# Patient Record
Sex: Female | Born: 1960 | Race: White | Hispanic: No | Marital: Married | Smoking: Never smoker
Health system: Southern US, Community
[De-identification: ages and names within clinical notes are randomized; demographics above are authoritative.]

## PROBLEM LIST (undated history)

## (undated) DIAGNOSIS — N3281 Overactive bladder: Secondary | ICD-10-CM

## (undated) DIAGNOSIS — M2560 Stiffness of unspecified joint, not elsewhere classified: Secondary | ICD-10-CM

## (undated) DIAGNOSIS — R32 Unspecified urinary incontinence: Secondary | ICD-10-CM

## (undated) HISTORY — DX: Stiffness of unspecified joint, not elsewhere classified: M25.60

## (undated) HISTORY — PX: FOOT SURGERY: SHX648

## (undated) HISTORY — PX: SHOULDER SURGERY: SHX246

## (undated) HISTORY — PX: CRYOTHERAPY: SHX1416

## (undated) HISTORY — PX: EYE SURGERY: SHX253

## (undated) HISTORY — DX: Overactive bladder: N32.81

## (undated) HISTORY — PX: CHOLECYSTECTOMY: SHX55

## (undated) HISTORY — DX: Unspecified urinary incontinence: R32

---

## 2004-10-10 ENCOUNTER — Ambulatory Visit: Payer: Self-pay | Admitting: Unknown Physician Specialty

## 2004-10-25 ENCOUNTER — Ambulatory Visit: Payer: Self-pay | Admitting: Unknown Physician Specialty

## 2005-11-06 ENCOUNTER — Ambulatory Visit: Payer: Self-pay | Admitting: Unknown Physician Specialty

## 2006-11-21 ENCOUNTER — Ambulatory Visit: Payer: Self-pay | Admitting: Unknown Physician Specialty

## 2006-12-03 ENCOUNTER — Encounter: Payer: Self-pay | Admitting: General Practice

## 2006-12-28 ENCOUNTER — Encounter: Payer: Self-pay | Admitting: General Practice

## 2007-01-14 ENCOUNTER — Ambulatory Visit: Payer: Self-pay

## 2007-03-25 ENCOUNTER — Encounter: Payer: Self-pay | Admitting: Orthopedic Surgery

## 2007-03-30 ENCOUNTER — Encounter: Payer: Self-pay | Admitting: Orthopedic Surgery

## 2007-04-27 ENCOUNTER — Encounter: Payer: Self-pay | Admitting: Orthopedic Surgery

## 2007-05-28 ENCOUNTER — Encounter: Payer: Self-pay | Admitting: Orthopedic Surgery

## 2007-06-27 ENCOUNTER — Encounter: Payer: Self-pay | Admitting: Orthopedic Surgery

## 2007-07-28 ENCOUNTER — Encounter: Payer: Self-pay | Admitting: Orthopedic Surgery

## 2007-08-27 ENCOUNTER — Encounter: Payer: Self-pay | Admitting: Orthopedic Surgery

## 2007-12-30 ENCOUNTER — Ambulatory Visit: Payer: Self-pay | Admitting: Unknown Physician Specialty

## 2008-08-11 ENCOUNTER — Encounter: Admission: RE | Admit: 2008-08-11 | Discharge: 2008-08-11 | Payer: Self-pay | Admitting: Orthopedic Surgery

## 2008-09-16 ENCOUNTER — Encounter: Payer: Self-pay | Admitting: Orthopedic Surgery

## 2008-09-26 ENCOUNTER — Encounter: Payer: Self-pay | Admitting: Orthopedic Surgery

## 2008-10-27 ENCOUNTER — Encounter: Payer: Self-pay | Admitting: Orthopedic Surgery

## 2008-11-26 ENCOUNTER — Encounter: Payer: Self-pay | Admitting: Orthopedic Surgery

## 2008-12-27 ENCOUNTER — Encounter: Payer: Self-pay | Admitting: Orthopedic Surgery

## 2009-01-11 ENCOUNTER — Ambulatory Visit: Payer: Self-pay | Admitting: Unknown Physician Specialty

## 2009-06-16 ENCOUNTER — Ambulatory Visit: Payer: Self-pay | Admitting: General Practice

## 2010-01-17 ENCOUNTER — Ambulatory Visit: Payer: Self-pay | Admitting: Unknown Physician Specialty

## 2011-02-07 ENCOUNTER — Ambulatory Visit: Payer: Self-pay | Admitting: Unknown Physician Specialty

## 2011-11-19 DIAGNOSIS — R3915 Urgency of urination: Secondary | ICD-10-CM | POA: Insufficient documentation

## 2011-11-19 DIAGNOSIS — R350 Frequency of micturition: Secondary | ICD-10-CM | POA: Insufficient documentation

## 2013-11-11 DIAGNOSIS — M19079 Primary osteoarthritis, unspecified ankle and foot: Secondary | ICD-10-CM | POA: Insufficient documentation

## 2013-11-13 DIAGNOSIS — R0609 Other forms of dyspnea: Secondary | ICD-10-CM | POA: Insufficient documentation

## 2013-11-13 DIAGNOSIS — R600 Localized edema: Secondary | ICD-10-CM | POA: Insufficient documentation

## 2013-11-24 DIAGNOSIS — J45909 Unspecified asthma, uncomplicated: Secondary | ICD-10-CM | POA: Insufficient documentation

## 2013-11-24 DIAGNOSIS — J302 Other seasonal allergic rhinitis: Secondary | ICD-10-CM | POA: Insufficient documentation

## 2013-11-24 DIAGNOSIS — D333 Benign neoplasm of cranial nerves: Secondary | ICD-10-CM | POA: Insufficient documentation

## 2013-11-24 DIAGNOSIS — G43909 Migraine, unspecified, not intractable, without status migrainosus: Secondary | ICD-10-CM | POA: Insufficient documentation

## 2014-11-25 DIAGNOSIS — R339 Retention of urine, unspecified: Secondary | ICD-10-CM | POA: Insufficient documentation

## 2014-11-26 ENCOUNTER — Ambulatory Visit: Payer: Self-pay | Admitting: Physician Assistant

## 2014-11-26 ENCOUNTER — Encounter: Payer: Self-pay | Admitting: Physician Assistant

## 2014-11-26 VITALS — BP 139/80 | HR 78 | Temp 98.1°F | Ht 63.0 in | Wt 191.0 lb

## 2014-11-26 DIAGNOSIS — R17 Unspecified jaundice: Secondary | ICD-10-CM

## 2014-11-26 DIAGNOSIS — R319 Hematuria, unspecified: Secondary | ICD-10-CM

## 2014-11-26 LAB — POCT URINALYSIS DIPSTICK
GLUCOSE UA: NEGATIVE
KETONES UA: NEGATIVE
Leukocytes, UA: NEGATIVE
Nitrite, UA: NEGATIVE
Protein, UA: NEGATIVE
UROBILINOGEN UA: 0.2
pH, UA: 5.5

## 2014-11-26 NOTE — Addendum Note (Signed)
Addended by: Versie Starks on: 11/26/2014 02:34 PM   Modules accepted: Orders

## 2014-11-26 NOTE — Progress Notes (Signed)
S:  Was seen by urologist yesterday and noted that there was bilirubin in her urine.  Told her she needed to see her pcp due to urine test.  Has no complaints, doesn't feel bad, just worried.  Didn't have blood in her urine yesterday  O:  Vitals wnl, nad, no cva tenderness, lungs c t a, cv rrr, urine dip trace blood 3+ bili  A:  Abnormal urine, bili  P: labs for hepatic panel

## 2014-11-30 LAB — COMPREHENSIVE METABOLIC PANEL
ALK PHOS: 79 IU/L (ref 39–117)
ALT: 16 IU/L (ref 0–32)
AST: 14 IU/L (ref 0–40)
Albumin/Globulin Ratio: 1.6 (ref 1.1–2.5)
Albumin: 4.2 g/dL (ref 3.5–5.5)
BILIRUBIN TOTAL: 0.3 mg/dL (ref 0.0–1.2)
BUN/Creatinine Ratio: 18 (ref 9–23)
BUN: 14 mg/dL (ref 6–24)
CHLORIDE: 103 mmol/L (ref 97–108)
CO2: 23 mmol/L (ref 18–29)
Calcium: 9.4 mg/dL (ref 8.7–10.2)
Creatinine, Ser: 0.77 mg/dL (ref 0.57–1.00)
GFR calc Af Amer: 102 mL/min/{1.73_m2} (ref >59)
GFR calc non Af Amer: 88 mL/min/{1.73_m2} (ref >59)
GLUCOSE: 127 mg/dL — AB (ref 65–99)
Globulin, Total: 2.6 g/dL (ref 1.5–4.5)
Potassium: 4.6 mmol/L (ref 3.5–5.2)
Sodium: 141 mmol/L (ref 134–144)
TOTAL PROTEIN: 6.8 g/dL (ref 6.0–8.5)

## 2014-12-02 NOTE — Progress Notes (Signed)
I Spoke with the patient about her lab results and she expressed understanding.

## 2015-01-10 ENCOUNTER — Encounter: Payer: Self-pay | Admitting: Physician Assistant

## 2015-01-10 ENCOUNTER — Ambulatory Visit: Payer: Self-pay | Admitting: Physician Assistant

## 2015-01-10 VITALS — BP 130/80 | HR 96 | Temp 98.6°F

## 2015-01-10 DIAGNOSIS — K219 Gastro-esophageal reflux disease without esophagitis: Secondary | ICD-10-CM | POA: Insufficient documentation

## 2015-01-10 MED ORDER — RANITIDINE HCL 150 MG PO TABS: 150.0000 mg | ORAL_TABLET | Freq: Two times a day (BID) | ORAL | Status: DC

## 2015-01-10 NOTE — Progress Notes (Signed)
S: c/o reflux, nexium had stopped working, was doing research and wants to be on h2 instead of ppi; can't always relate it to food, has gotten a wedge for her bed, that helped some, ?if etodolac making it worse  O: vitals wnl, nad, lungs c t a, cv rrr  A: Gerd  P: switch from etodolac to turmeric, try aloe supplement in water, zantac 150mg  bid #60 6 refills

## 2015-02-09 ENCOUNTER — Ambulatory Visit (INDEPENDENT_AMBULATORY_CARE_PROVIDER_SITE_OTHER): Payer: PRIVATE HEALTH INSURANCE

## 2015-02-09 ENCOUNTER — Ambulatory Visit (INDEPENDENT_AMBULATORY_CARE_PROVIDER_SITE_OTHER): Payer: PRIVATE HEALTH INSURANCE | Admitting: Podiatry

## 2015-02-09 ENCOUNTER — Encounter: Payer: Self-pay | Admitting: Podiatry

## 2015-02-09 VITALS — BP 125/78 | HR 85 | Resp 16

## 2015-02-09 DIAGNOSIS — M779 Enthesopathy, unspecified: Secondary | ICD-10-CM

## 2015-02-09 DIAGNOSIS — M79673 Pain in unspecified foot: Secondary | ICD-10-CM | POA: Diagnosis not present

## 2015-02-09 MED ORDER — METHYLPREDNISOLONE 4 MG PO TBPK: ORAL_TABLET | ORAL | Status: DC

## 2015-02-09 NOTE — Progress Notes (Signed)
   Subjective:    Patient ID: Kristy Boyd, female    DOB: 10/05/1960, 54 y.o.   MRN: [id number]  HPI: She presents today as a 54 year old female with a chief complaint of neuroma forefoot bilaterally. She states that she has had neuromas before and she feels that this dull achy pain that she is experiencing beneath the second and third metatarsophalangeal joints is very similar. She states that she has the radiating pain out the toes like she did when she had neuromas however soreness in the forefoot is similar. She denies any trauma. She states that she is only able to wear open heel shoes such as [employer] or [employer]. She has had neuroma surgery by Dr. Elvina Mattes bilaterally several years ago. She has a history of plantar fasciitis and states that her only shoe gear and also the fasciitis is what she is currently wearing. She states that she is unable to wear closed heel shoes causing causes her forefoot to hurt.    Review of Systems  HENT: Positive for sinus pressure.   Cardiovascular: Positive for leg swelling.  Skin: Positive for rash.  All other systems reviewed and are negative.      Objective:   Physical Exam: 54 year old female in no apparent distress presents vital signs stable alert and oriented 3. Pulses are strongly palpable. Neurologic sensorium is intact per Semmes-Weinstein monofilament. Deep tendon reflexes are intact bilateral and muscle strength +5 over 5 dorsiflexion plantar flexors and inverters everters all intrinsic musculature is intact. Orthopedic evaluation demonstrates all joints distal to the ankle have full range of motion without crepitation. She is mild flexible hammertoe deformities with pain on palpation on an range of motion of the second and third metatarsophalangeal joints bilaterally. She has no pain on palpation to the third interdigital spaces where her neuromas were resected. She states that she is doing quite well with this. Radiographs 3 views taken today  do not demonstrate any type of osseus abnormalities other than slightly elongated second and third metatarsals. Flexible hammertoes are noted on exam with pain on end range of motion consistent with capsulitis rather than neuroma. Cutaneous evaluation demonstrates no skin breakdown and no erythema or cellulitis drainage or odor.        Assessment & Plan:  Assessment: Capsulitis second and third metatarsophalangeal joints bilaterally probably associated with abnormal biomechanics due to shoe gear.  Plan: Discussed etiology pathology conservative versus surgical therapies. At this point and started her on a Medrol Dosepak to be followed by etodolac. I also injected the bilateral forefoot today between the second and third metatarsals with Kenalog and local anesthetic. I will follow-up with her in 1 month. We did discuss appropriate shoe gear stretching exercises ice therapy issue your modifications.

## 2015-03-14 ENCOUNTER — Ambulatory Visit: Payer: PRIVATE HEALTH INSURANCE | Admitting: Podiatry

## 2015-03-21 ENCOUNTER — Ambulatory Visit: Payer: PRIVATE HEALTH INSURANCE | Admitting: Podiatry

## 2015-04-13 ENCOUNTER — Ambulatory Visit: Payer: Self-pay | Admitting: Physician Assistant

## 2015-04-13 ENCOUNTER — Encounter: Payer: Self-pay | Admitting: Physician Assistant

## 2015-04-13 ENCOUNTER — Other Ambulatory Visit: Payer: Self-pay | Admitting: Emergency Medicine

## 2015-04-13 VITALS — BP 119/70 | HR 75 | Temp 97.7°F

## 2015-04-13 DIAGNOSIS — M62838 Other muscle spasm: Secondary | ICD-10-CM

## 2015-04-13 MED ORDER — FLUTICASONE PROPIONATE 50 MCG/ACT NA SUSP: NASAL | Status: DC

## 2015-04-13 MED ORDER — CYCLOBENZAPRINE HCL 10 MG PO TABS: 10.0000 mg | ORAL_TABLET | Freq: Three times a day (TID) | ORAL | Status: DC | PRN

## 2015-04-13 NOTE — Progress Notes (Signed)
S: c/o r sided neck spasm, no known injury, used bengay and heat with a little relief but still has large spasm, no numbness or tingling, no loss of motion of neck or shoulder, using ibuprofen instead of lodine  O: vitals wnl, nad, cspine nontender, + large spasm r shoulder going into upper back, grips = b/l, full rom of shoulder and neck, lungs c t a, cv rrr  A: acute muscle spasm r shoulder  P: flexeril, continue ibuprofen, use wet heat and follow with ice

## 2015-04-13 NOTE — Telephone Encounter (Signed)
Med refill approved, just saw patient this am

## 2015-04-13 NOTE — Telephone Encounter (Signed)
Received a faxed medication request from [facility].  Please advise.  Thank you. 

## 2015-04-14 ENCOUNTER — Other Ambulatory Visit: Payer: Self-pay | Admitting: Emergency Medicine

## 2015-04-14 MED ORDER — ETODOLAC 400 MG PO TABS: 400.0000 mg | ORAL_TABLET | Freq: Two times a day (BID) | ORAL | Status: DC

## 2015-04-14 NOTE — Telephone Encounter (Signed)
Med refill approved, pt seen in clinic yesterday

## 2015-04-14 NOTE — Telephone Encounter (Signed)
Received a faxed medication request from [facility].  Please advise.  Thank you. 

## 2015-05-03 ENCOUNTER — Other Ambulatory Visit: Payer: Self-pay | Admitting: Physician Assistant

## 2015-05-06 ENCOUNTER — Other Ambulatory Visit: Payer: Self-pay | Admitting: Physician Assistant

## 2015-06-28 ENCOUNTER — Other Ambulatory Visit: Payer: Self-pay | Admitting: Physician Assistant

## 2015-06-29 NOTE — Telephone Encounter (Signed)
Med refill approved 

## 2015-07-18 ENCOUNTER — Other Ambulatory Visit: Payer: Self-pay | Admitting: Emergency Medicine

## 2015-07-19 MED ORDER — ETODOLAC 400 MG PO TABS: 400.0000 mg | ORAL_TABLET | Freq: Every day | ORAL | Status: DC

## 2015-07-19 NOTE — Telephone Encounter (Signed)
Med refill approved 

## 2015-08-29 ENCOUNTER — Other Ambulatory Visit: Payer: Self-pay | Admitting: Physician Assistant

## 2015-08-29 DIAGNOSIS — K219 Gastro-esophageal reflux disease without esophagitis: Secondary | ICD-10-CM

## 2015-08-29 MED ORDER — RANITIDINE HCL 150 MG PO TABS: 150.0000 mg | ORAL_TABLET | Freq: Two times a day (BID) | ORAL | Status: DC

## 2015-10-07 ENCOUNTER — Encounter: Payer: Self-pay | Admitting: Physician Assistant

## 2015-10-07 ENCOUNTER — Ambulatory Visit: Payer: Self-pay | Admitting: Physician Assistant

## 2015-10-07 DIAGNOSIS — K219 Gastro-esophageal reflux disease without esophagitis: Secondary | ICD-10-CM

## 2015-10-07 MED ORDER — RANITIDINE HCL 150 MG PO TABS: 150.0000 mg | ORAL_TABLET | Freq: Two times a day (BID) | ORAL | 6 refills | Status: DC

## 2015-10-07 MED ORDER — FLUTICASONE PROPIONATE 50 MCG/ACT NA SUSP: 2.0000 | Freq: Every day | NASAL | 6 refills | Status: DC

## 2015-10-07 MED ORDER — ETODOLAC 400 MG PO TABS: 400.0000 mg | ORAL_TABLET | Freq: Every day | ORAL | 6 refills | Status: DC

## 2015-10-07 MED ORDER — SOLIFENACIN SUCCINATE 5 MG PO TABS: 5.0000 mg | ORAL_TABLET | Freq: Every day | ORAL | 6 refills | Status: DC

## 2015-10-07 MED ORDER — FLUTICASONE PROPIONATE 50 MCG/ACT NA SUSP: NASAL | 6 refills | Status: DC

## 2015-10-07 MED ORDER — ETODOLAC 400 MG PO TABS: 400.0000 mg | ORAL_TABLET | Freq: Two times a day (BID) | ORAL | 3 refills | Status: DC

## 2015-10-07 NOTE — Progress Notes (Signed)
   Subjective:    Patient ID: Kristy Boyd, female    DOB: September 12, 1960, 55 y.o.   MRN: [id number]  HPI Patient for annual physical and lab test. States only concern is episodic joint pain lasting 3-5 days per month.     Review of Systems    urinary urgency and micturition. GERD and Allergic Rhinitis. Objective:   Physical Exam HEENT unremarkable except for edematous nasal turbinates. Neck supple without adenopathy. Lungs CTA and Heart RRR. No spinal deformity, No CVA guarding.  F/E ROM of lumbar spine. No upper/lower extremities deformity. F/E ROM, and strength 5/%. Urinary/Gential deferred to treating GYN Doctor. CNII-XII grossly intact.       Assessment & Plan:Well Exam  Refill Ranitidine, Etodolac, Vericare, Vitamin "D" and Flonase.  Follow up treating GYN Doctor. Return with onset of episodic joint pain to consider Sed Rate and muscle enzyme test.

## 2015-10-07 NOTE — Addendum Note (Signed)
Addended by: Rudene Anda T on: 10/07/2015 09:06 AM   Modules accepted: Orders

## 2015-10-08 LAB — CMP12+LP+TP+TSH+6AC+CBC/D/PLT
ALBUMIN: 4.5 g/dL (ref 3.5–5.5)
ALK PHOS: 79 IU/L (ref 39–117)
ALT: 15 IU/L (ref 0–32)
AST: 18 IU/L (ref 0–40)
Albumin/Globulin Ratio: 1.7 (ref 1.2–2.2)
BASOS: 0 %
BUN/Creatinine Ratio: 18 (ref 9–23)
BUN: 13 mg/dL (ref 6–24)
Basophils Absolute: 0 10*3/uL (ref 0.0–0.2)
Bilirubin Total: 0.4 mg/dL (ref 0.0–1.2)
CALCIUM: 9.3 mg/dL (ref 8.7–10.2)
CHOL/HDL RATIO: 2.2 ratio (ref 0.0–4.4)
CREATININE: 0.71 mg/dL (ref 0.57–1.00)
Chloride: 100 mmol/L (ref 96–106)
Cholesterol, Total: 167 mg/dL (ref 100–199)
EOS (ABSOLUTE): 0.1 10*3/uL (ref 0.0–0.4)
Eos: 1 %
Free Thyroxine Index: 1.9 (ref 1.2–4.9)
GFR calc Af Amer: 112 mL/min/{1.73_m2} (ref >59)
GFR calc non Af Amer: 97 mL/min/{1.73_m2} (ref >59)
GGT: 28 IU/L (ref 0–60)
Globulin, Total: 2.7 g/dL (ref 1.5–4.5)
Glucose: 112 mg/dL — ABNORMAL HIGH (ref 65–99)
HDL: 76 mg/dL (ref >39)
HEMATOCRIT: 42 % (ref 34.0–46.6)
Hemoglobin: 14.1 g/dL (ref 11.1–15.9)
IMMATURE GRANS (ABS): 0 10*3/uL (ref 0.0–0.1)
Immature Granulocytes: 0 %
Iron: 123 ug/dL (ref 27–159)
LDH: 167 IU/L (ref 119–226)
LDL CALC: 78 mg/dL (ref 0–99)
LYMPHS ABS: 2.3 10*3/uL (ref 0.7–3.1)
LYMPHS: 30 %
MCH: 30.6 pg (ref 26.6–33.0)
MCHC: 33.6 g/dL (ref 31.5–35.7)
MCV: 91 fL (ref 79–97)
MONOS ABS: 0.5 10*3/uL (ref 0.1–0.9)
Monocytes: 7 %
NEUTROS ABS: 4.6 10*3/uL (ref 1.4–7.0)
Neutrophils: 62 %
PHOSPHORUS: 3.5 mg/dL (ref 2.5–4.5)
POTASSIUM: 4.6 mmol/L (ref 3.5–5.2)
Platelets: 395 10*3/uL — ABNORMAL HIGH (ref 150–379)
RBC: 4.61 x10E6/uL (ref 3.77–5.28)
RDW: 13.4 % (ref 12.3–15.4)
SODIUM: 140 mmol/L (ref 134–144)
T3 Uptake Ratio: 23 % — ABNORMAL LOW (ref 24–39)
T4, Total: 8.1 ug/dL (ref 4.5–12.0)
TSH: 1.26 u[IU]/mL (ref 0.450–4.500)
Total Protein: 7.2 g/dL (ref 6.0–8.5)
Triglycerides: 66 mg/dL (ref 0–149)
Uric Acid: 4.3 mg/dL (ref 2.5–7.1)
VLDL Cholesterol Cal: 13 mg/dL (ref 5–40)
WBC: 7.5 10*3/uL (ref 3.4–10.8)

## 2015-10-08 LAB — VITAMIN D 25 HYDROXY (VIT D DEFICIENCY, FRACTURES): Vit D, 25-Hydroxy: 30.1 ng/mL (ref 30.0–100.0)

## 2015-10-12 LAB — SPECIMEN STATUS REPORT

## 2015-10-12 LAB — HGB A1C W/O EAG: Hgb A1c MFr Bld: 6.1 % — ABNORMAL HIGH (ref 4.8–5.6)

## 2015-10-19 ENCOUNTER — Ambulatory Visit: Payer: Self-pay | Admitting: Physician Assistant

## 2015-10-20 ENCOUNTER — Ambulatory Visit: Payer: Self-pay | Admitting: Physician Assistant

## 2015-10-20 ENCOUNTER — Encounter: Payer: Self-pay | Admitting: Physician Assistant

## 2015-10-20 VITALS — BP 122/84 | HR 80 | Temp 98.2°F

## 2015-10-20 DIAGNOSIS — R300 Dysuria: Secondary | ICD-10-CM

## 2015-10-20 LAB — POCT URINALYSIS DIPSTICK
GLUCOSE UA: NEGATIVE
KETONES UA: NEGATIVE
Nitrite, UA: NEGATIVE
Protein, UA: NEGATIVE
SPEC GRAV UA: 1.01
Urobilinogen, UA: 0.2
pH, UA: 5.5

## 2015-10-20 MED ORDER — CIPROFLOXACIN HCL 250 MG PO TABS: 250.0000 mg | ORAL_TABLET | Freq: Two times a day (BID) | ORAL | 0 refills | Status: DC

## 2015-10-20 NOTE — Progress Notes (Signed)
S:  C/o uti sx for 2 days, burning, urgency, frequency, denies vaginal discharge, abdominal pain or flank pain: some sinus pressure , has been drinking a lot of water today to help with sx; Remainder ros neg  O:  Vitals wnl, nad, ent wnl,  no cva tenderness, back nontender, lungs c t a,cv rrr, abd soft nontender, bs normal, n/v intact ua trace leuks, trace blood  A: uti  P: cipro 250mg  bid x 7d, increase water intake, add cranberry juice, return if not improving in 2 -3 days, return earlier if worsening, discussed pyelonephritis sx

## 2015-12-02 ENCOUNTER — Ambulatory Visit: Payer: Self-pay | Admitting: Physician Assistant

## 2015-12-02 ENCOUNTER — Encounter: Payer: Self-pay | Admitting: Physician Assistant

## 2015-12-02 VITALS — BP 110/78 | HR 72 | Temp 98.1°F

## 2015-12-02 DIAGNOSIS — R059 Cough, unspecified: Secondary | ICD-10-CM

## 2015-12-02 DIAGNOSIS — R05 Cough: Secondary | ICD-10-CM

## 2015-12-02 MED ORDER — ALBUTEROL SULFATE HFA 108 (90 BASE) MCG/ACT IN AERS: 2.0000 | INHALATION_SPRAY | Freq: Four times a day (QID) | RESPIRATORY_TRACT | 0 refills | Status: DC | PRN

## 2015-12-02 MED ORDER — BENZONATATE 200 MG PO CAPS: 200.0000 mg | ORAL_CAPSULE | Freq: Three times a day (TID) | ORAL | 0 refills | Status: DC | PRN

## 2015-12-02 MED ORDER — MONTELUKAST SODIUM 10 MG PO TABS
10.0000 mg | ORAL_TABLET | Freq: Every day | ORAL | 3 refills | Status: DC
Start: 2015-12-02 — End: 2016-03-10

## 2015-12-02 MED ORDER — TRIAMCINOLONE ACETONIDE 55 MCG/ACT NA AERO: 2.0000 | INHALATION_SPRAY | Freq: Every day | NASAL | 12 refills | Status: DC

## 2015-12-02 NOTE — Progress Notes (Signed)
S: here for chronic cough for about a month since ragweed season started, used otc mucinex without relief, ENT doctor told her to stop antihistamines last winter, wasn't sure she should go back on these, cough is sporadic, harsh, and will sometimes hear a wheeze;  states she feels like something is constantly draining in her throat and feels like when she swallows it gets stuck there, no mucus production, no fever/chills, no cp/sob, does state her voice is hoarse  O: vitals wnl, nad, tms clear, nasal mucosa pinkish red, throat wnl, neck supple no lymph, lungs c t a, cv rrr  A: chronic cough most likely due to pnd from seasonal allergies, ?reactive airway disease  P: singulair 10mg  , tessalon perls, albuterol inhaler, nasacort, stop flonase, add claritin during allergy season only, if not improving by next week can try steroid pack, if still not better after that consider cxr and/or GI referal

## 2016-03-10 ENCOUNTER — Other Ambulatory Visit: Payer: Self-pay | Admitting: Physician Assistant

## 2016-03-12 NOTE — Telephone Encounter (Signed)
Med refill for singulair approved 

## 2016-05-23 ENCOUNTER — Encounter: Payer: Self-pay | Admitting: Obstetrics & Gynecology

## 2016-05-23 ENCOUNTER — Ambulatory Visit (INDEPENDENT_AMBULATORY_CARE_PROVIDER_SITE_OTHER): Payer: Managed Care, Other (non HMO) | Admitting: Obstetrics & Gynecology

## 2016-05-23 VITALS — BP 110/70 | HR 68 | Ht 63.0 in | Wt 216.0 lb

## 2016-05-23 DIAGNOSIS — N951 Menopausal and female climacteric states: Secondary | ICD-10-CM

## 2016-05-23 DIAGNOSIS — Z1211 Encounter for screening for malignant neoplasm of colon: Secondary | ICD-10-CM

## 2016-05-23 DIAGNOSIS — Z0000 Encounter for general adult medical examination without abnormal findings: Secondary | ICD-10-CM | POA: Diagnosis not present

## 2016-05-23 MED ORDER — ESTRADIOL 2 MG PO TABS: 2.0000 mg | ORAL_TABLET | Freq: Every day | ORAL | 12 refills | Status: DC

## 2016-05-23 MED ORDER — PROGESTERONE MICRONIZED 100 MG PO CAPS: 100.0000 mg | ORAL_CAPSULE | Freq: Every day | ORAL | 12 refills | Status: DC

## 2016-05-23 NOTE — Patient Instructions (Signed)
1-          Call Kristy Boyd to schedule Mammogram  2-   Menopause and Hormone Replacement Therapy What is hormone replacement therapy? Hormone replacement therapy (HRT) is the use of artificial (synthetic) hormones to replace hormones that your body stops producing during menopause. Menopause is the normal time of life when menstrual periods stop completely and the ovaries stop producing the female hormones estrogen and progesterone. This lack of hormones can affect your health and cause undesirable symptoms. HRT can relieve some of those symptoms. What are my options for HRT? HRT may consist of the synthetic hormones estrogen and progestin, or it may consist of only estrogen (estrogen-only therapy). You and your health care provider will decide which form of HRT is best for you. If you choose to be on HRT and you have a uterus, estrogen and progestin are usually prescribed. Estrogen-only therapy is used for women who do not have a uterus. Possible options for taking HRT include:  Pills.  Patches.  Gels.  Sprays.  Vaginal cream.  Vaginal rings.  Vaginal inserts. The amount of hormone(s) that you take and how long you take the hormone(s) varies depending on your individual health. It is important to:  Begin HRT with the lowest possible dosage.  Stop HRT as soon as your health care provider tells you to stop.  Work with your health care provider so that you feel informed and comfortable with your decisions. What are the benefits of HRT? HRT can reduce the frequency and severity of menopausal symptoms. Benefits of HRT vary depending on the menopausal symptoms that you have, the severity of your symptoms, and your overall health. HRT may help to improve the following menopausal symptoms:  Hot flashes and night sweats. These are sudden feelings of heat that spread over the face and body. The skin may turn red, like a blush. Night sweats are hot flashes that happen while you are sleeping  or trying to sleep.  Bone loss (osteoporosis). The body loses calcium more quickly after menopause, causing the bones to become weaker. This can increase the risk for bone breaks (fractures).  Vaginal dryness. The lining of the vagina can become thin and dry, which can cause pain during sexual intercourse or cause infection, burning, or itching.  Urinary tract infections.  Urinary incontinence. This is a decreased ability to control when you urinate.  Irritability.  Short-term memory problems. What are the risks of HRT? Risks of HRT vary depending on your individual health and medical history. Risks of HRT also depend on whether you receive both estrogen and progestin or you receive estrogen only.HRT may increase the risk of:  Spotting. This is when a small amount of bloodleaks from the vagina unexpectedly.  Endometrial cancer. This cancer is in the lining of the uterus (endometrium).  Breast cancer.  Increased density of breast tissue. This can make it harder to find breast cancer on a breast X-ray (mammogram).  Stroke.  Heart attack.  Blood clots.  Gallbladder disease. Risks of HRT can increase if you have any of the following conditions:  Endometrial cancer.  Liver disease.  Heart disease.  Breast cancer.  History of blood clots.  History of stroke. How should I care for myself while I am on HRT?  Take over-the-counter and prescription medicines only as told by your health care provider.  Get mammograms, pelvic exams, and medical checkups as often as told by your health care provider.  Have Pap tests done as often as  told by your health care provider. A Pap test is sometimes called a Pap smear. It is a screening test that is used to check for signs of cancer of the cervix and vagina. A Pap test can also identify the presence of infection or precancerous changes. Pap tests may be done:  Every 3 years, starting at age 57.  Every 5 years, starting after age 41,  in combination with testing for human papillomavirus (HPV).  More often or less often depending on other medical conditions you have, your age, and other risk factors.  It is your responsibility to get your Pap test results. Ask your health care provider or the department performing the test when your results will be ready.  Keep all follow-up visits as told by your health care provider. This is important. When should I seek medical care? Talk with your health care provider if:  You have any of these:  Pain or swelling in your legs.  Shortness of breath.  Chest pain.  Lumps or changes in your breasts or armpits.  Slurred speech.  Pain, burning, or bleeding when you urine.  You develop any of these:  Unusual vaginal bleeding.  Dizziness or headaches.  Weakness or numbness in any part of your arms or legs.  Pain in your abdomen. This information is not intended to replace advice given to you by your health care provider. Make sure you discuss any questions you have with your health care provider. Document Released: 11/11/2002 Document Revised: 01/10/2016 Document Reviewed: 08/16/2014 Elsevier Interactive Patient Education  2017 [employer].

## 2016-05-23 NOTE — Progress Notes (Signed)
HPI:      [name] is a 56 y.o. G0P0000 who LMP was in the past, she presents today for her annual examination.  The patient has no complaints today. The patient is sexually active. 2016 (no HPV PAP) last pap: was normal. The patient is taking hormone replacement therapy, doing well w regimen started last fall; denies side effect. Patient denies post-menopausal vaginal bleeding.   The patient has regular exercise: yes.   GYN Hx: Last Colonoscopy:4 years ago. Normal.  Last DEXA: never.  PMHx: She  has a past medical history of Joint stiffness and Overactive bladder. Also,  has a past surgical history that includes Cholecystectomy., family history includes Diabetes in her father and paternal aunt; Heart failure in her father; Thyroid disease in her mother.,  reports that she has never smoked. She has never used smokeless tobacco. She reports that she drinks alcohol. She reports that she does not use drugs.  She has a current medication list which includes the following prescription(s): etodolac, fluticasone, ranitidine, solifenacin, vitamin d (ergocalciferol), estradiol, and progesterone. Also, is allergic to codeine; pseudoephedrine; and sulfa antibiotics.  Review of Systems  Constitutional: Negative for chills, fever and malaise/fatigue.  HENT: Negative for congestion, sinus pain and sore throat.   Eyes: Negative for blurred vision and pain.  Respiratory: Negative for cough and wheezing.   Cardiovascular: Negative for chest pain and leg swelling.  Gastrointestinal: Negative for abdominal pain, constipation, diarrhea, heartburn, nausea and vomiting.  Genitourinary: Negative for dysuria, frequency, hematuria and urgency.  Musculoskeletal: Negative for back pain, joint pain, myalgias and neck pain.  Skin: Negative for itching and rash.  Neurological: Negative for dizziness, tremors and weakness.  Endo/Heme/Allergies: Does not bruise/bleed easily.  Psychiatric/Behavioral: Negative  for depression. The patient is not nervous/anxious and does not have insomnia.     Objective: BP 110/70   Pulse 68   Ht 5\' 3"  (1.6 m)   Wt 216 lb (98 kg)   BMI 38.26 kg/m  Physical Exam  Constitutional: She is oriented to person, place, and time. She appears well-developed and well-nourished. No distress.  Genitourinary: Rectum normal, vagina normal and uterus normal. Pelvic exam was performed with patient supine. There is no rash or lesion on the right labia. There is no rash or lesion on the left labia. Vagina exhibits no lesion. No bleeding in the vagina. Right adnexum does not display mass and does not display tenderness. Left adnexum does not display mass and does not display tenderness. Cervix does not exhibit motion tenderness, lesion, friability or polyp.   Uterus is mobile and midaxial. Uterus is not enlarged or exhibiting a mass.  HENT:  Head: Normocephalic and atraumatic. Head is without laceration.  Right Ear: Hearing normal.  Left Ear: Hearing normal.  Nose: No epistaxis.  No foreign bodies.  Mouth/Throat: Uvula is midline, oropharynx is clear and moist and mucous membranes are normal.  Eyes: Pupils are equal, round, and reactive to light.  Neck: Normal range of motion. Neck supple. No thyromegaly present.  Cardiovascular: Normal rate and regular rhythm.  Exam reveals no gallop and no friction rub.   No murmur heard. Pulmonary/Chest: Effort normal and breath sounds normal. No respiratory distress. She has no wheezes. Right breast exhibits no mass, no skin change and no tenderness. Left breast exhibits no mass, no skin change and no tenderness.  Abdominal: Soft. Bowel sounds are normal. She exhibits no distension. There is no tenderness. There is no rebound.  Musculoskeletal: Normal range of  motion.  Neurological: She is alert and oriented to person, place, and time. No cranial nerve deficit.  Skin: Skin is warm and dry.  Psychiatric: She has a normal mood and affect.  Judgment normal.  Vitals reviewed.   Assessment: Annual Exam 1. Annual physical exam   2. Menopausal hot flushes   3. Screen for colon cancer    Plan:            1.  Cervical Screening-  Pap smear done today  2. Breast screening- Exam annually and mammogram scheduled  3. Colonoscopy every 10 years, Hemoccult testing after age 55  4. Labs per PCP   5. Counseling for hormonal therapy: wants to continue HRT at current dose due to moodiness and hot flashes    F/U  Return in about 1 year (around 05/23/2017) for Annual.  Barnett Applebaum, MD, [facility], [facility] 05/23/2016  8:37 AM

## 2016-05-25 ENCOUNTER — Other Ambulatory Visit: Payer: Self-pay | Admitting: Obstetrics & Gynecology

## 2016-05-25 DIAGNOSIS — Z1231 Encounter for screening mammogram for malignant neoplasm of breast: Secondary | ICD-10-CM

## 2016-05-26 LAB — IGP, APTIMA HPV
HPV APTIMA: NEGATIVE
PAP SMEAR COMMENT: 0

## 2016-06-05 ENCOUNTER — Other Ambulatory Visit: Payer: Self-pay | Admitting: Physician Assistant

## 2016-06-05 DIAGNOSIS — K219 Gastro-esophageal reflux disease without esophagitis: Secondary | ICD-10-CM

## 2016-07-03 ENCOUNTER — Ambulatory Visit: Payer: Self-pay

## 2016-07-03 ENCOUNTER — Ambulatory Visit: Payer: Self-pay | Admitting: Family

## 2016-07-03 VITALS — BP 125/80 | HR 91 | Temp 98.2°F

## 2016-07-03 DIAGNOSIS — J019 Acute sinusitis, unspecified: Secondary | ICD-10-CM

## 2016-07-03 MED ORDER — AZITHROMYCIN 250 MG PO TABS: ORAL_TABLET | ORAL | 0 refills | Status: DC

## 2016-07-03 MED ORDER — PREDNISONE 10 MG PO TABS: 30.0000 mg | ORAL_TABLET | Freq: Every day | ORAL | 0 refills | Status: DC

## 2016-07-03 NOTE — Progress Notes (Signed)
S/ seasonal allergies , now with acute sxs , ST, increased PND, blowing , pressure in ears, fatigue and chills the weekend , denies gi complaints, O/ mildly ill VSS ENT tms very retracted and dull, nasal mucosa very swollen, red with copious mucopurulent d/c and max tenderness, neck supple, heart rsr lungs clear A/ acute rhinosinusitis P/ Rx zpack pred pulse x 3 days. Supportive measures discussed. Follow up prn not improving    Nasal saline products bid and prn

## 2016-07-07 ENCOUNTER — Other Ambulatory Visit: Payer: Self-pay | Admitting: Physician Assistant

## 2016-07-07 DIAGNOSIS — K219 Gastro-esophageal reflux disease without esophagitis: Secondary | ICD-10-CM

## 2016-07-09 ENCOUNTER — Other Ambulatory Visit: Payer: Self-pay | Admitting: Physician Assistant

## 2016-07-10 NOTE — Telephone Encounter (Signed)
Med refill for mobic approved 

## 2016-07-11 ENCOUNTER — Other Ambulatory Visit: Payer: Self-pay | Admitting: Physician Assistant

## 2016-07-11 DIAGNOSIS — K219 Gastro-esophageal reflux disease without esophagitis: Secondary | ICD-10-CM

## 2016-07-12 NOTE — Telephone Encounter (Signed)
Med refill for etodolac approved

## 2016-08-03 ENCOUNTER — Telehealth: Payer: Self-pay | Admitting: Emergency Medicine

## 2016-08-03 NOTE — Telephone Encounter (Signed)
Patient called and expressed that she has finished her antibiotics and continues to have a sore throat.  Patient wanted to know if we can call in [employer] [facility] Mouthwash in to [facility] in [place].  I consulted Ron Clent Jacks, PA-C and he authorized me to call in [employer] Magic Mouthwash disp 52ml take 74ml 3 or 4 times a day with no refills.

## 2016-09-08 ENCOUNTER — Other Ambulatory Visit: Payer: Self-pay | Admitting: Physician Assistant

## 2016-09-08 DIAGNOSIS — K219 Gastro-esophageal reflux disease without esophagitis: Secondary | ICD-10-CM

## 2016-09-12 ENCOUNTER — Other Ambulatory Visit: Payer: Self-pay | Admitting: Emergency Medicine

## 2016-09-12 DIAGNOSIS — K219 Gastro-esophageal reflux disease without esophagitis: Secondary | ICD-10-CM

## 2016-09-13 MED ORDER — SOLIFENACIN SUCCINATE 5 MG PO TABS: 5.0000 mg | ORAL_TABLET | Freq: Every day | ORAL | 6 refills | Status: DC

## 2016-09-13 NOTE — Telephone Encounter (Signed)
Med refill for vesicare approved

## 2016-09-28 ENCOUNTER — Other Ambulatory Visit: Payer: Self-pay

## 2016-09-28 DIAGNOSIS — Z299 Encounter for prophylactic measures, unspecified: Secondary | ICD-10-CM

## 2016-09-28 NOTE — Progress Notes (Signed)
Patient came in to have blood drawn for testing for an upcoming appointment with Manuela Schwartz.

## 2016-09-29 LAB — CMP12+LP+TP+TSH+6AC+CBC/D/PLT
A/G RATIO: 1.6 (ref 1.2–2.2)
ALBUMIN: 4.4 g/dL (ref 3.5–5.5)
ALT: 15 IU/L (ref 0–32)
AST: 19 IU/L (ref 0–40)
Alkaline Phosphatase: 91 IU/L (ref 39–117)
BASOS ABS: 0 10*3/uL (ref 0.0–0.2)
BUN/Creatinine Ratio: 21 (ref 9–23)
BUN: 13 mg/dL (ref 6–24)
Basos: 0 %
Bilirubin Total: 0.3 mg/dL (ref 0.0–1.2)
Calcium: 9.2 mg/dL (ref 8.7–10.2)
Chloride: 102 mmol/L (ref 96–106)
Chol/HDL Ratio: 2 ratio (ref 0.0–4.4)
Cholesterol, Total: 183 mg/dL (ref 100–199)
Creatinine, Ser: 0.63 mg/dL (ref 0.57–1.00)
EOS (ABSOLUTE): 0.1 10*3/uL (ref 0.0–0.4)
Eos: 1 %
Estimated CHD Risk: <0.5 times avg. (ref 0.0–1.0)
Free Thyroxine Index: 1.9 (ref 1.2–4.9)
GFR calc non Af Amer: 101 mL/min/{1.73_m2} (ref >59)
GFR, EST AFRICAN AMERICAN: 117 mL/min/{1.73_m2} (ref >59)
GGT: 16 IU/L (ref 0–60)
GLOBULIN, TOTAL: 2.8 g/dL (ref 1.5–4.5)
Glucose: 101 mg/dL — ABNORMAL HIGH (ref 65–99)
HDL: 93 mg/dL (ref >39)
Hematocrit: 41.5 % (ref 34.0–46.6)
Hemoglobin: 14.5 g/dL (ref 11.1–15.9)
IMMATURE GRANS (ABS): 0 10*3/uL (ref 0.0–0.1)
IMMATURE GRANULOCYTES: 0 %
IRON: 125 ug/dL (ref 27–159)
LDH: 177 IU/L (ref 119–226)
LDL Calculated: 78 mg/dL (ref 0–99)
LYMPHS: 29 %
Lymphocytes Absolute: 2.7 10*3/uL (ref 0.7–3.1)
MCH: 32 pg (ref 26.6–33.0)
MCHC: 34.9 g/dL (ref 31.5–35.7)
MCV: 92 fL (ref 79–97)
MONOCYTES: 7 %
Monocytes Absolute: 0.7 10*3/uL (ref 0.1–0.9)
NEUTROS PCT: 63 %
Neutrophils Absolute: 5.8 10*3/uL (ref 1.4–7.0)
PHOSPHORUS: 3.7 mg/dL (ref 2.5–4.5)
PLATELETS: 409 10*3/uL — AB (ref 150–379)
Potassium: 4.3 mmol/L (ref 3.5–5.2)
RBC: 4.53 x10E6/uL (ref 3.77–5.28)
RDW: 13.6 % (ref 12.3–15.4)
Sodium: 136 mmol/L (ref 134–144)
T3 UPTAKE RATIO: 20 % — AB (ref 24–39)
T4 TOTAL: 9.6 ug/dL (ref 4.5–12.0)
TOTAL PROTEIN: 7.2 g/dL (ref 6.0–8.5)
TRIGLYCERIDES: 61 mg/dL (ref 0–149)
TSH: 1.08 u[IU]/mL (ref 0.450–4.500)
Uric Acid: 4.4 mg/dL (ref 2.5–7.1)
VLDL Cholesterol Cal: 12 mg/dL (ref 5–40)
WBC: 9.4 10*3/uL (ref 3.4–10.8)

## 2016-09-29 LAB — VITAMIN D 25 HYDROXY (VIT D DEFICIENCY, FRACTURES): VIT D 25 HYDROXY: 29.4 ng/mL — AB (ref 30.0–100.0)

## 2016-09-29 LAB — HGB A1C W/O EAG: Hgb A1c MFr Bld: 5.6 % (ref 4.8–5.6)

## 2016-10-02 ENCOUNTER — Ambulatory Visit: Payer: Self-pay | Admitting: Physician Assistant

## 2016-10-02 ENCOUNTER — Encounter: Payer: Self-pay | Admitting: Physician Assistant

## 2016-10-02 VITALS — BP 130/80 | HR 81 | Temp 98.5°F | Resp 16 | Ht 63.0 in | Wt 212.0 lb

## 2016-10-02 DIAGNOSIS — K529 Noninfective gastroenteritis and colitis, unspecified: Secondary | ICD-10-CM

## 2016-10-02 DIAGNOSIS — Z299 Encounter for prophylactic measures, unspecified: Secondary | ICD-10-CM

## 2016-10-02 DIAGNOSIS — Z0000 Encounter for general adult medical examination without abnormal findings: Secondary | ICD-10-CM

## 2016-10-02 NOTE — Progress Notes (Signed)
S: pt here for wellness physical, states she is having chronic problems with diarrhea that have been worsening, sx started after she had her gallbladder removed years ago, but would go 2-3 weeks and not have a problem, now its daily and interfering with daily activities;  no other complaints ros neg. PMH:  See chart  Social:  Nonsmoker Fam: see chart  O: vitals wnl, nad, ENT wnl, neck supple no lymph, lungs c t a, cv rrr, abd soft nontender bs normal all 4 quads  A: wellness physical  P: will refer to GI, repeat labs as instructed for increased platelets and low t3, mammogram ordered at pt's request

## 2016-10-18 ENCOUNTER — Other Ambulatory Visit: Payer: Self-pay | Admitting: Physician Assistant

## 2016-10-18 DIAGNOSIS — K219 Gastro-esophageal reflux disease without esophagitis: Secondary | ICD-10-CM

## 2016-10-18 NOTE — Telephone Encounter (Signed)
Med refill for zantac approved

## 2016-11-06 ENCOUNTER — Encounter (INDEPENDENT_AMBULATORY_CARE_PROVIDER_SITE_OTHER): Payer: Self-pay

## 2016-11-06 ENCOUNTER — Ambulatory Visit (INDEPENDENT_AMBULATORY_CARE_PROVIDER_SITE_OTHER): Payer: Managed Care, Other (non HMO) | Admitting: Gastroenterology

## 2016-11-06 ENCOUNTER — Other Ambulatory Visit
Admission: RE | Admit: 2016-11-06 | Discharge: 2016-11-06 | Disposition: A | Discharge status: Home / Self Care | Payer: Managed Care, Other (non HMO) | Source: Ambulatory Visit | Attending: Gastroenterology | Admitting: Gastroenterology

## 2016-11-06 ENCOUNTER — Encounter: Payer: Self-pay | Admitting: Gastroenterology

## 2016-11-06 VITALS — BP 116/75 | HR 88 | Temp 98.1°F | Ht 63.0 in | Wt 212.4 lb

## 2016-11-06 DIAGNOSIS — G43909 Migraine, unspecified, not intractable, without status migrainosus: Secondary | ICD-10-CM | POA: Insufficient documentation

## 2016-11-06 DIAGNOSIS — D333 Benign neoplasm of cranial nerves: Secondary | ICD-10-CM | POA: Insufficient documentation

## 2016-11-06 DIAGNOSIS — L719 Rosacea, unspecified: Secondary | ICD-10-CM | POA: Insufficient documentation

## 2016-11-06 DIAGNOSIS — R197 Diarrhea, unspecified: Secondary | ICD-10-CM

## 2016-11-06 LAB — C-REACTIVE PROTEIN: CRP: 2.1 mg/dL — AB (ref <1.0)

## 2016-11-06 LAB — SEDIMENTATION RATE: SED RATE: 13 mm/h (ref 0–30)

## 2016-11-06 MED ORDER — CHOLESTYRAMINE 4 G PO PACK: 4.0000 g | PACK | Freq: Two times a day (BID) | ORAL | 0 refills | Status: DC

## 2016-11-06 NOTE — Progress Notes (Signed)
Kristy Darby, Boyd [address]  [place]  [place], [place] [address]  Main: [phone]  Fax: [phone]    Gastroenterology Consultation  Referring Provider:     Versie Starks, PA-C Primary Care Physician:  Kristy Starks, PA-C Primary Gastroenterologist:  Dr. Cephas Boyd Reason for Consultation:     Diarrhea        HPI:   Kristy Boyd is a 56 y.o. y/o female referred by Dr. Caryn Boyd, Kristy Dolin, PA-C  for consultation & management of Diarrhea for the last 4 months. Rushes to bathroom for explosive BM after breakfast, sometimes upto 3 BMs daily, consistency is pasty, Non bloody Worse with bacon, pan cake, waffle syrup, egg and cheese biscuits etc Worse in last 54month, every day after breakfast and sometimes after lunch No nocturnal diarrhea or urgency Denies upper GI symptoms, not a/w bloating Used to have similar symptoms but only once a month Lap chole done in 1990, for gall stones, symptoms started since surgery but not as frequent  Denies weight loss, nausea, vomiting, abdominal pain, one is about weight gain Did not try bile acid sequestrants She did not have any stool studies done Unknown H. 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 Denies fam h/o GI malignancy  GI Procedures: Colonoscopy 06/02/2012  By Dr Kristy Boyd normal, no random biopsies were performed Upper endoscopy at the same time, report not available  Past Medical History:  Diagnosis Date  . Joint stiffness   . Overactive bladder     Past Surgical History:  Procedure Laterality Date  . CHOLECYSTECTOMY      Prior to Admission medications   Medication Sig Start Date End Date Taking? Authorizing Provider  estradiol (ESTRACE) 2 MG  tablet Take 1 tablet (2 mg total) by mouth daily. 05/23/16  Yes HGae Dry Boyd  etodolac (LODINE) 400 MG tablet TAKE ONE (1) TABLET BY MOUTH TWO (2) TIMES DAILY 07/12/16  Yes FCaryn SectionSLinden Dolin PA-C  fluticasone (FLONASE) 50 MCG/ACT nasal spray 2 sprays by Each Nare  daily. 10/07/15  Yes SSable Feil PA-C  progesterone (PROMETRIUM) 100 MG capsule Take 1 capsule (100 mg total) by mouth daily. 05/23/16  Yes HGae Dry Boyd  ranitidine (ZANTAC) 150 MG tablet TAKE ONE (1) TABLET BY MOUTH TWO (2) TIMES DAILY 10/18/16  Yes FCaryn SectionSLinden Dolin PA-C  solifenacin (VESICARE) 5 MG tablet Take 1 tablet (5 mg total) by mouth daily. 09/13/16  Yes Kristy Boyd, SLinden Dolin PA-C  Vitamin D, Ergocalciferol, (DRISDOL) 50000 units CAPS capsule TAKE ONE CAPSULE BY MOUTH ONCE A WEEK 07/10/16  Yes FCaryn SectionSLinden Dolin PA-C    Family History  Problem Relation Age of Onset  . Thyroid disease Mother   . Heart failure Father   . Diabetes Father   . Diabetes Paternal Aunt      Social History  Substance Use Topics  . Smoking status: Never Smoker  . Smokeless tobacco: Never Used  . Alcohol use 0.0 oz/week    Allergies as of 11/06/2016 - Review Complete 11/06/2016  Allergen Reaction Noted  . Codeine Other (See Comments) 11/26/2014  . Pseudoephedrine Other (See Comments) 11/26/2014  . Sulfa antibiotics Other (See Comments) 11/11/2013    Review of  Systems:    All systems reviewed and negative except where noted in HPI.   Physical Exam:  BP 116/75   Pulse 88   Temp 98.1 F (36.7 C) (Oral)   Ht '5\' 3"'$  (1.6 m)   Wt 212 lb 6.4 oz (96.3 kg)   BMI 37.62 kg/m  No LMP recorded. Patient is postmenopausal.  General:   Alert,  Well-developed, well-nourished, pleasant and cooperative in NAD Head:  Normocephalic and atraumatic. Eyes:  Sclera clear, no icterus.   Conjunctiva pink. Ears:  Normal auditory acuity. Nose:  No deformity, discharge, or lesions. Mouth:  No deformity or lesions,oropharynx pink & moist. Neck:   Supple; no masses or thyromegaly. Lungs:  Respirations even and unlabored.  Clear throughout to auscultation.   No wheezes, crackles, or rhonchi. No acute distress. Heart:  Regular rate and rhythm; no murmurs, clicks, rubs, or gallops. Abdomen:  Normal bowel sounds.  No bruits.  Soft, non-tender and non-distended without masses, hepatosplenomegaly or hernias noted.  No guarding or rebound tenderness.   Rectal: Nor performed Msk:  Symmetrical without gross deformities. Good, equal movement & strength bilaterally. Pulses:  Normal pulses noted. Extremities:  No clubbing or edema.  No cyanosis. Neurologic:  Alert and oriented x3;  grossly normal neurologically. Skin:  Intact without significant lesions or rashes. No jaundice. Lymph Nodes:  No significant cervical adenopathy. Psych:  Alert and cooperative. Normal mood and affect.  Imaging Studies: None  Assessment and Plan:   [name] is a 56 y.o. y/o female with No significant past medical history, status post laparoscopic cholecystectomy, chronic diarrhea 4 months. Bile salt diarrhea or functional diarrhea or microscopy colitis or celiac disease or infectious or inflammatory which is less likely. She does not have anemia.  - Check stool studies including C. difficile and GI pathogen panel - Check H. pylori stool antigen - Check serum TTG IgA, total IgA levels, ESR, CRP - Check fecal pancreatic elastase - Trial of cholestyramine 4 g 1-2 times daily for possible bile salt diarrhea - If above workup is unremarkable, and she continues to have persistent diarrhea despite trial of cholestyramine, recommend colonoscopy with random biopsies to evaluate for microscopic colitis - I will try to obtain her upper endoscopy report  Follow up in 2 months   Kristy Darby, Boyd

## 2016-11-07 LAB — IGA: IgA: 203 mg/dL (ref 87–352)

## 2016-11-09 ENCOUNTER — Encounter: Payer: Self-pay | Admitting: Physician Assistant

## 2016-11-09 ENCOUNTER — Ambulatory Visit: Payer: Self-pay | Admitting: Physician Assistant

## 2016-11-09 VITALS — BP 120/80 | HR 98 | Temp 98.5°F | Resp 16

## 2016-11-09 DIAGNOSIS — N390 Urinary tract infection, site not specified: Secondary | ICD-10-CM

## 2016-11-09 LAB — POCT URINALYSIS DIPSTICK
Ketones, UA: NEGATIVE
NITRITE UA: POSITIVE
PH UA: 5.5 (ref 5.0–8.0)
Spec Grav, UA: <=1.005 — AB (ref 1.010–1.025)
UROBILINOGEN UA: 1 U/dL

## 2016-11-09 MED ORDER — PHENAZOPYRIDINE HCL 200 MG PO TABS: 200.0000 mg | ORAL_TABLET | Freq: Three times a day (TID) | ORAL | 0 refills | Status: DC | PRN

## 2016-11-09 MED ORDER — CIPROFLOXACIN HCL 500 MG PO TABS: 500.0000 mg | ORAL_TABLET | Freq: Two times a day (BID) | ORAL | 0 refills | Status: DC

## 2016-11-09 NOTE — Addendum Note (Signed)
Addended by: Rudene Anda T on: 11/09/2016 10:58 AM   Modules accepted: Orders

## 2016-11-09 NOTE — Progress Notes (Signed)
   Subjective: Dysuria    Patient ID: Kristy Boyd, female    DOB: 03/10/1960, 56 y.o.   MRN: [id number]  HPI Patient awaken this morning with dysuria, urgency, and frequency. Denies, fever vaginal discharge, or flank pain.   Review of Systems Negative except for compliant.    Objective:   Physical Exam Dip UA positive for Nitrate and Leukocytes.       Assessment & Plan:UTU  Take Cipro and Pyridium as directed. Follow up with PCP in 10 days.

## 2016-11-14 ENCOUNTER — Telehealth: Payer: Self-pay

## 2016-11-14 NOTE — Telephone Encounter (Signed)
Will  Try to contact patient on work # because number for home was wrong number. Thanks [employer]

## 2016-11-20 ENCOUNTER — Telehealth: Payer: Self-pay

## 2016-11-20 NOTE — Telephone Encounter (Signed)
Patient has been informed her CRP is mildly elevated concerning for inflammation.She plans on completed her  stool studies this week.  She stated that she did had an UTI and was waiting til it cleared up.  I told her you also recommended colonoscopy to evaluate for IBD. upper endoscopy report has been placed on your desk.  Pt will call back to schedule her Colonoscopy after she looks at work schedule.  Thanks [employer]

## 2016-11-21 ENCOUNTER — Telehealth: Payer: Self-pay | Admitting: Gastroenterology

## 2016-11-21 NOTE — Telephone Encounter (Signed)
Received EGD and colonoscopy reports from Dr. Michaelle Copas office  EGD 06/12/2012  The examined esophagus was normal Localized moderate inflammation characterized by erosions and erythema was found in the gastric antrum. Biopsies were taken. The examined duodenum was normal  Pathology results not sent  Colonoscopy 06/02/2012 The perianal and digital rectal examinations were normal The colon in the examined portion appeared normal The quality of the bowel prep patient was good  Cephas Darby, MD [place]  [place], [place] [address]  Main: [phone]  Fax: [phone] Pager: [phone]

## 2016-11-22 ENCOUNTER — Other Ambulatory Visit
Admission: RE | Admit: 2016-11-22 | Discharge: 2016-11-22 | Disposition: A | Discharge status: Home / Self Care | Payer: Managed Care, Other (non HMO) | Source: Ambulatory Visit | Attending: Gastroenterology | Admitting: Gastroenterology

## 2016-11-22 DIAGNOSIS — R197 Diarrhea, unspecified: Secondary | ICD-10-CM | POA: Diagnosis present

## 2016-11-22 LAB — GASTROINTESTINAL PANEL BY PCR, STOOL (REPLACES STOOL CULTURE)
ASTROVIRUS: NOT DETECTED
Adenovirus F40/41: NOT DETECTED
CAMPYLOBACTER SPECIES: NOT DETECTED
Cryptosporidium: NOT DETECTED
Cyclospora cayetanensis: NOT DETECTED
ENTAMOEBA HISTOLYTICA: NOT DETECTED
ENTEROTOXIGENIC E COLI (ETEC): NOT DETECTED
Enteroaggregative E coli (EAEC): NOT DETECTED
Enteropathogenic E coli (EPEC): NOT DETECTED
Giardia lamblia: NOT DETECTED
NOROVIRUS GI/GII: NOT DETECTED
PLESIMONAS SHIGELLOIDES: NOT DETECTED
ROTAVIRUS A: NOT DETECTED
SALMONELLA SPECIES: NOT DETECTED
SAPOVIRUS (I, II, IV, AND V): NOT DETECTED
SHIGA LIKE TOXIN PRODUCING E COLI (STEC): NOT DETECTED
SHIGELLA/ENTEROINVASIVE E COLI (EIEC): NOT DETECTED
VIBRIO SPECIES: NOT DETECTED
Vibrio cholerae: NOT DETECTED
Yersinia enterocolitica: NOT DETECTED

## 2016-11-24 LAB — MISC LABCORP TEST (SEND OUT): Labcorp test code: 86207

## 2016-11-24 LAB — H. PYLORI ANTIGEN, STOOL: H. PYLORI STOOL AG, EIA: NEGATIVE

## 2016-11-27 ENCOUNTER — Ambulatory Visit: Payer: Self-pay | Admitting: Family

## 2016-11-27 VITALS — BP 120/70 | HR 85 | Temp 98.5°F | Resp 16

## 2016-11-27 DIAGNOSIS — Z299 Encounter for prophylactic measures, unspecified: Secondary | ICD-10-CM

## 2016-11-27 LAB — POCT URINALYSIS DIPSTICK
Blood, UA: NEGATIVE
GLUCOSE UA: NEGATIVE
Ketones, UA: NEGATIVE
Leukocytes, UA: NEGATIVE
NITRITE UA: NEGATIVE
Protein, UA: NEGATIVE
SPEC GRAV UA: 1.015 (ref 1.010–1.025)
UROBILINOGEN UA: 0.2 U/dL
pH, UA: 7 (ref 5.0–8.0)

## 2016-11-27 NOTE — Progress Notes (Signed)
S/denies symptoms of UTI, here for follow-up dip one week after treatment with Cipro O/:dip negative for nitrites. leukocytes A /history of recurrent UTI  P encouraged hydration with water, limit caffeine, continue Vesicare.

## 2016-11-28 LAB — PANCREATIC ELASTASE, FECAL: Pancreatic Elastase-1, Stool: >500 ug Elast./g (ref >200)

## 2016-11-30 ENCOUNTER — Telehealth: Payer: Self-pay

## 2016-11-30 NOTE — Telephone Encounter (Signed)
Patient has been notified her work up for diarrhea so far came back negative. If cholestyramine is not helping with her diarrhea,you would recommend colonoscopy with biopsies. She said she has not started the medication yet as to she has been out of town and didn't want to take a risk.  She will let us know how it does for her once she starts to take it.  Thanks [employer]

## 2016-12-17 ENCOUNTER — Telehealth: Payer: Self-pay

## 2016-12-17 ENCOUNTER — Telehealth: Payer: Self-pay | Admitting: Gastroenterology

## 2016-12-17 ENCOUNTER — Other Ambulatory Visit: Payer: Self-pay

## 2016-12-17 MED ORDER — CHOLESTYRAMINE 4 G PO PACK: 4.0000 g | PACK | Freq: Two times a day (BID) | ORAL | 1 refills | Status: DC

## 2016-12-17 NOTE — Telephone Encounter (Signed)
Patient would like for you to call her back. She needs to let you know how she is feeling. She goes by Kristy Boyd

## 2016-12-17 NOTE — Telephone Encounter (Signed)
Pt requesting call back regarding Mammogram scheduling. [phone].

## 2016-12-17 NOTE — Telephone Encounter (Signed)
Patient contacted office to let us know that her Cholestyramine is working tremendously and she requested a rx to be sent to pharmacy.  Rx has been sent. Thanks [employer]

## 2016-12-18 ENCOUNTER — Other Ambulatory Visit: Payer: Self-pay | Admitting: Obstetrics & Gynecology

## 2016-12-18 DIAGNOSIS — Z1231 Encounter for screening mammogram for malignant neoplasm of breast: Secondary | ICD-10-CM

## 2017-01-04 ENCOUNTER — Other Ambulatory Visit: Payer: Self-pay | Admitting: Physician Assistant

## 2017-01-04 DIAGNOSIS — K219 Gastro-esophageal reflux disease without esophagitis: Secondary | ICD-10-CM

## 2017-01-08 ENCOUNTER — Ambulatory Visit
Admission: RE | Admit: 2017-01-08 | Discharge: 2017-01-08 | Disposition: A | Discharge status: Home / Self Care | Payer: Managed Care, Other (non HMO) | Source: Ambulatory Visit | Attending: Obstetrics & Gynecology | Admitting: Obstetrics & Gynecology

## 2017-01-08 DIAGNOSIS — Z1231 Encounter for screening mammogram for malignant neoplasm of breast: Secondary | ICD-10-CM | POA: Insufficient documentation

## 2017-01-15 ENCOUNTER — Inpatient Hospital Stay
Admission: RE | Admit: 2017-01-15 | Discharge: 2017-01-15 | Disposition: A | Discharge status: Home / Self Care | Payer: Self-pay | Source: Ambulatory Visit | Attending: *Deleted | Admitting: *Deleted

## 2017-01-15 ENCOUNTER — Other Ambulatory Visit: Payer: Self-pay | Admitting: *Deleted

## 2017-01-15 DIAGNOSIS — Z9289 Personal history of other medical treatment: Secondary | ICD-10-CM

## 2017-02-18 ENCOUNTER — Other Ambulatory Visit: Payer: Self-pay | Admitting: Gastroenterology

## 2017-03-27 ENCOUNTER — Telehealth: Payer: Self-pay | Admitting: Gastroenterology

## 2017-03-27 ENCOUNTER — Telehealth: Payer: Self-pay

## 2017-03-27 ENCOUNTER — Other Ambulatory Visit: Payer: Self-pay | Admitting: Physician Assistant

## 2017-03-27 ENCOUNTER — Other Ambulatory Visit: Payer: Self-pay

## 2017-03-27 DIAGNOSIS — K219 Gastro-esophageal reflux disease without esophagitis: Secondary | ICD-10-CM

## 2017-03-27 DIAGNOSIS — N951 Menopausal and female climacteric states: Secondary | ICD-10-CM

## 2017-03-27 MED ORDER — ESTRADIOL 2 MG PO TABS: 2.0000 mg | ORAL_TABLET | Freq: Every day | ORAL | 12 refills | Status: DC

## 2017-03-27 MED ORDER — CHOLESTYRAMINE 4 G PO PACK: PACK | ORAL | 2 refills | Status: DC

## 2017-03-27 MED ORDER — SOLIFENACIN SUCCINATE 5 MG PO TABS: 5.0000 mg | ORAL_TABLET | Freq: Every day | ORAL | 6 refills | Status: DC

## 2017-03-27 MED ORDER — RANITIDINE HCL 150 MG PO TABS: ORAL_TABLET | ORAL | 6 refills | Status: DC

## 2017-03-27 MED ORDER — ETODOLAC 400 MG PO TABS: ORAL_TABLET | ORAL | 6 refills | Status: DC

## 2017-03-27 MED ORDER — PROGESTERONE MICRONIZED 100 MG PO CAPS: 100.0000 mg | ORAL_CAPSULE | Freq: Every day | ORAL | 12 refills | Status: DC

## 2017-03-27 NOTE — Telephone Encounter (Signed)
*  STAT* If patient is at the pharmacy, call can be transferred to refill team.   1. Which medications need to be refilled? (please list name of each medication and dose if known) Cholestyramin 4 g packet  2. Which pharmacy/location (including street and city if local pharmacy) is medication to be sent to? [facility] in [place]  # [phone]   3. Do they need a 30 day or 90 day supply? 30 day   Generic?  [facility] in [place] closed down so needs new Rx.

## 2017-03-27 NOTE — Progress Notes (Signed)
   Subjective: Medication refill    Patient ID: Kristy Boyd, female    DOB: 01-03-1961, 57 y.o.   MRN: [id number]  HPI Patient requests refill of medication secondary to closing up previous pharmacy.   Review of Systems Negative    Objective:   Physical Exam Deferred       Assessment & Plan: Medication refill  Patient refill for Vesicare, Zantac,Prometrium, Flonase, Etodolac, Estrace and Questran re-ordered. Follow up with PCP.

## 2017-03-27 NOTE — Telephone Encounter (Signed)
Spoke w/pt. She states that her rx's have since showed up in the system and all is well now.

## 2017-03-27 NOTE — Telephone Encounter (Signed)
Pt states her rx's are lost in cyberspace. [facility] was to transfer them to [facility] in [place]. They were not. [facility] claims they cannot retrieve them. Pt requesting rx's be sent to [facility]. [phone].

## 2017-03-28 ENCOUNTER — Ambulatory Visit: Payer: Self-pay

## 2017-03-28 ENCOUNTER — Other Ambulatory Visit: Payer: Self-pay

## 2017-03-28 DIAGNOSIS — K219 Gastro-esophageal reflux disease without esophagitis: Secondary | ICD-10-CM

## 2017-03-28 DIAGNOSIS — N3281 Overactive bladder: Secondary | ICD-10-CM

## 2017-03-28 MED ORDER — SOLIFENACIN SUCCINATE 5 MG PO TABS: 5.0000 mg | ORAL_TABLET | Freq: Every day | ORAL | 6 refills | Status: DC

## 2017-04-05 ENCOUNTER — Ambulatory Visit: Payer: Self-pay | Admitting: Physician Assistant

## 2017-04-05 ENCOUNTER — Encounter: Payer: Self-pay | Admitting: Physician Assistant

## 2017-04-05 VITALS — BP 130/80 | HR 64 | Temp 98.5°F | Resp 18

## 2017-04-05 DIAGNOSIS — R309 Painful micturition, unspecified: Secondary | ICD-10-CM

## 2017-04-05 DIAGNOSIS — N3281 Overactive bladder: Secondary | ICD-10-CM

## 2017-04-05 LAB — POCT URINALYSIS DIPSTICK
Blood, UA: NEGATIVE
Glucose, UA: NEGATIVE
Ketones, UA: NEGATIVE
LEUKOCYTES UA: NEGATIVE
Nitrite, UA: NEGATIVE
Protein, UA: NEGATIVE
Urobilinogen, UA: 0.2 E.U./dL
pH, UA: 5.5 (ref 5.0–8.0)

## 2017-04-05 MED ORDER — SOLIFENACIN SUCCINATE 5 MG PO TABS: 5.0000 mg | ORAL_TABLET | Freq: Every day | ORAL | 6 refills | Status: DC

## 2017-04-05 NOTE — Progress Notes (Signed)
   Subjective: Medication refill    Patient ID: Kristy Boyd, female    DOB: July 31, 1960, 57 y.o.   MRN: [id number]  HPI Patient presents for medication refill of Vesicare for overactive bladder.   Review of Systems Negative except for complaint    Objective:   Physical Exam  UA unremarkable.      Assessment & Plan: Medication refill  Prescription for Vesicare with generic brand authorized.  Follow-up as needed with urologist.

## 2017-05-27 ENCOUNTER — Encounter: Payer: Self-pay | Admitting: Obstetrics & Gynecology

## 2017-05-27 ENCOUNTER — Ambulatory Visit (INDEPENDENT_AMBULATORY_CARE_PROVIDER_SITE_OTHER): Payer: Managed Care, Other (non HMO) | Admitting: Obstetrics & Gynecology

## 2017-05-27 VITALS — BP 120/80 | HR 77 | Ht 63.0 in | Wt 215.0 lb

## 2017-05-27 DIAGNOSIS — Z0000 Encounter for general adult medical examination without abnormal findings: Secondary | ICD-10-CM | POA: Diagnosis not present

## 2017-05-27 DIAGNOSIS — Z1211 Encounter for screening for malignant neoplasm of colon: Secondary | ICD-10-CM

## 2017-05-27 DIAGNOSIS — N951 Menopausal and female climacteric states: Secondary | ICD-10-CM | POA: Insufficient documentation

## 2017-05-27 DIAGNOSIS — Z1231 Encounter for screening mammogram for malignant neoplasm of breast: Secondary | ICD-10-CM | POA: Diagnosis not present

## 2017-05-27 DIAGNOSIS — N3281 Overactive bladder: Secondary | ICD-10-CM | POA: Diagnosis not present

## 2017-05-27 DIAGNOSIS — R922 Inconclusive mammogram: Secondary | ICD-10-CM | POA: Diagnosis not present

## 2017-05-27 DIAGNOSIS — Z1239 Encounter for other screening for malignant neoplasm of breast: Secondary | ICD-10-CM

## 2017-05-27 MED ORDER — PROGESTERONE MICRONIZED 100 MG PO CAPS: 100.0000 mg | ORAL_CAPSULE | Freq: Every day | ORAL | 12 refills | Status: DC

## 2017-05-27 MED ORDER — ESTRADIOL 2 MG PO TABS: 2.0000 mg | ORAL_TABLET | Freq: Every day | ORAL | 12 refills | Status: DC

## 2017-05-27 NOTE — Progress Notes (Signed)
HPI:      [name] is a 57 y.o. G0P0000 who LMP was in the past, she presents today for her annual examination.  The patient has no complaints today. The patient is sexually active. Herlast pap: approximate date 2018 and was normal, last mammogram: approximate date 12/2016 and was normal and labs every summer thru PCP.  The patient does perform self breast exams.  There is no notable family history of breast or ovarian cancer in her family. The patient is taking hormone replacement therapy. Patient denies post-menopausal vaginal bleeding.   The patient has regular exercise: yes. The patient denies current symptoms of depression.    GYN Hx: Last Colonoscopy:5 years ago. Normal.  Last DEXA: never ago.    PMHx: Past Medical History:  Diagnosis Date  . Joint stiffness   . Overactive bladder   . Urinary incontinence    Past Surgical History:  Procedure Laterality Date  . CHOLECYSTECTOMY    . CRYOTHERAPY    . FOOT SURGERY    . SHOULDER SURGERY     Family History  Problem Relation Age of Onset  . Thyroid disease Mother   . Heart failure Father   . Diabetes Father   . Diabetes Paternal Aunt   . Breast cancer Neg Hx    Social History   Tobacco Use  . Smoking status: Never Smoker  . Smokeless tobacco: Never Used  Substance Use Topics  . Alcohol use: Yes    Alcohol/week: 0.0 oz  . Drug use: No    Current Outpatient Medications:  .  estradiol (ESTRACE) 2 MG tablet, Take 1 tablet (2 mg total) by mouth daily., Disp: 30 tablet, Rfl: 12 .  etodolac (LODINE) 400 MG tablet, TAKE ONE (1) TABLET BY MOUTH TWO (2) TIMES DAILY, Disp: 60 tablet, Rfl: 6 .  fluticasone (FLONASE) 50 MCG/ACT nasal spray, 2 sprays by Each Nare  daily., Disp: 16 g, Rfl: 6 .  ranitidine (ZANTAC) 150 MG tablet, TAKE ONE (1) TABLET BY MOUTH TWO (2) TIMES DAILY, Disp: 60 tablet, Rfl: 6 .  Vitamin D, Ergocalciferol, (DRISDOL) 50000 units CAPS capsule, TAKE ONE CAPSULE BY MOUTH ONCE A WEEK, Disp: 12 capsule,  Rfl: 3 .  cholestyramine (QUESTRAN) 4 g packet, Take one packet by mouth twice daily, Disp: 60 each, Rfl: 2 .  ciprofloxacin (CIPRO) 500 MG tablet, Take 1 tablet (500 mg total) by mouth 2 (two) times daily. (Patient not taking: Reported on 05/27/2017), Disp: 20 tablet, Rfl: 0 .  desoximetasone (TOPICORT) 0.25 % cream, APPLY SMALL AMOUNT TO SKIN TWICE DAILY AS NEEDED FOR HANDS. DECREASE WITH IMPROVEMENT. NOT FOR FACE, GROIN, UNDER-ARMS. (Patient not taking: Reported on 05/27/2017), Disp: 60 g, Rfl: 6 .  phenazopyridine (PYRIDIUM) 200 MG tablet, Take 1 tablet (200 mg total) by mouth 3 (three) times daily as needed for pain. (Patient not taking: Reported on 05/27/2017), Disp: 6 tablet, Rfl: 0 .  progesterone (PROMETRIUM) 100 MG capsule, Take 1 capsule (100 mg total) by mouth daily., Disp: 30 capsule, Rfl: 12 .  solifenacin (VESICARE) 5 MG tablet, Take 1 tablet (5 mg total) by mouth daily., Disp: 30 tablet, Rfl: 6 .  solifenacin (VESICARE) 5 MG tablet, Take 1 tablet (5 mg total) by mouth daily. (Patient not taking: Reported on 05/27/2017), Disp: 30 tablet, Rfl: 6 Allergies: Codeine; Pseudoephedrine; and Sulfa antibiotics  Review of Systems  Constitutional: Negative for chills, fever and malaise/fatigue.  HENT: Negative for congestion, sinus pain and sore throat.   Eyes: Negative  for blurred vision and pain.  Respiratory: Negative for cough and wheezing.   Cardiovascular: Negative for chest pain and leg swelling.  Gastrointestinal: Negative for abdominal pain, constipation, diarrhea, heartburn, nausea and vomiting.  Genitourinary: Negative for dysuria, frequency, hematuria and urgency.  Musculoskeletal: Positive for joint pain. Negative for back pain, myalgias and neck pain.  Skin: Negative for itching and rash.  Neurological: Negative for dizziness, tremors and weakness.  Endo/Heme/Allergies: Does not bruise/bleed easily.  Psychiatric/Behavioral: Negative for depression. The patient is not nervous/anxious  and does not have insomnia.    Objective: BP 120/80   Pulse 77   Ht 5\' 3"  (1.6 m)   Wt 215 lb (97.5 kg)   BMI 38.09 kg/m   Filed Weights   05/27/17 1505  Weight: 215 lb (97.5 kg)   Body mass index is 38.09 kg/m. Physical Exam  Constitutional: She is oriented to person, place, and time. She appears well-developed and well-nourished. No distress.  Genitourinary: Rectum normal, vagina normal and uterus normal. Pelvic exam was performed with patient supine. There is no rash or lesion on the right labia. There is no rash or lesion on the left labia. Vagina exhibits no lesion. No bleeding in the vagina. Right adnexum does not display mass and does not display tenderness. Left adnexum does not display mass and does not display tenderness. Cervix does not exhibit motion tenderness, lesion, friability or polyp.   Uterus is mobile and midaxial. Uterus is not enlarged or exhibiting a mass.  HENT:  Head: Normocephalic and atraumatic. Head is without laceration.  Right Ear: Hearing normal.  Left Ear: Hearing normal.  Nose: No epistaxis.  No foreign bodies.  Mouth/Throat: Uvula is midline, oropharynx is clear and moist and mucous membranes are normal.  Eyes: Pupils are equal, round, and reactive to light.  Neck: Normal range of motion. Neck supple. No thyromegaly present.  Cardiovascular: Normal rate and regular rhythm. Exam reveals no gallop and no friction rub.  No murmur heard. Pulmonary/Chest: Effort normal and breath sounds normal. No respiratory distress. She has no wheezes. Right breast exhibits no mass, no skin change and no tenderness. Left breast exhibits no mass, no skin change and no tenderness.  Abdominal: Soft. Bowel sounds are normal. She exhibits no distension. There is no tenderness. There is no rebound.  Musculoskeletal: Normal range of motion.  Neurological: She is alert and oriented to person, place, and time. No cranial nerve deficit.  Skin: Skin is warm and dry.    Psychiatric: She has a normal mood and affect. Judgment normal.  Vitals reviewed.   Assessment: Annual Exam 1. Annual physical exam   2. Screen for colon cancer   3. Overactive bladder   4. Menopause syndrome   5. Screening for breast cancer   6. Dense breast tissue on mammogram   7. Menopausal hot flushes    Plan:            1.  Cervical Screening-  Pap smear schedule reviewed with patient  2. Breast screening- Exam annually and mammogram scheduled (3D MMG next visit requested)  3. Colonoscopy every 10 years, Hemoccult testing after age 56  4. Labs managed by PCP   5. Counseling for hormonal therapy: no change in therapy today; Cont HRT w Estradiol 2 mg and Prometrium 100 mg daily I have discussed HRT with the patient in detail.  The risk/benefits of it were reviewed.  She understands that during menopause Estrogen decreases dramatically and that this results in an increased risk  of cardiovascular disease as well as osteoporosis.  We have also discussed the fact that hot flashes often result from a decrease in Estrogen, and that by replacing Estrogen, they can often be alleviated.  We have discussed skin, vaginal and urinary tract changes that may also take place from this drop in Estrogen.  Emotional changes have also been linked to Estrogen and we have briefly discussed this.  The benefits of HRT including decrease in hot flashes, vaginal dryness, and osteoporosis were discussed.  The emotional benefit and a possible change in her cardiovascular risk profile was also reviewed.  The risks associated with Hormone Replacement Therapy were also reviewed.  The use of unopposed Estrogen and its relationship to endometrial cancer was discussed.  The addition of Progesterone and its beneficial effect on endometrial cancer was also noted.  The fact that there has been no consistent definitive studies showing an increase in breast cancer in women who use HRT was discussed with the patient.  The  possible side effects including breast tenderness, fluid retention, mood changes and vaginal bleeding were discussed.  The patient was informed that this is an elective medication and that she may choose not to take Hormone Replacement Therapy.  Literature on HRT was given, and I believe that after answering all of the patient's questions, she has an adequate and informed understanding of HRT.  Special emphasis on the WHI study, as well as several studies since that pertaining to the risks and benefits of estrogen replacement therapy were compared.  The possible limitations of these studies were discussed including the age stratification of the WHI study.  The possible role of Progesterone in these studies was discussed in detail.  I believe that the patient has an informed knowledge of the risks and benefits of HRT.  I have specifically discussed WHI findings and current updates.  Different type of hormone formulation and methods of taking hormone replacement therapy discussed.   6. OAB, cont Oxybutynin or other meds as PCP treats  7. Obesity, counseled as to weight loss options, interventions    F/U  Return in about 1 year (around 05/28/2018) for Annual.  Kristy Applebaum, MD, [facility], [facility] 05/27/2017  3:58 PM

## 2017-05-27 NOTE — Patient Instructions (Signed)
PAP every three years Mammogram every year    Call [phone] to schedule at [facility] Colonoscopy every 10 years Labs yearly (with PCP) 

## 2017-08-14 ENCOUNTER — Ambulatory Visit: Payer: Self-pay

## 2017-08-28 ENCOUNTER — Ambulatory Visit: Payer: Self-pay | Admitting: Family Medicine

## 2017-08-28 VITALS — BP 144/69 | HR 78 | Resp 16 | Ht 63.0 in | Wt 207.0 lb

## 2017-08-28 DIAGNOSIS — Z79899 Other long term (current) drug therapy: Secondary | ICD-10-CM

## 2017-08-28 NOTE — Progress Notes (Signed)
Subjective: Medication refill Patient presents to obtain a refill for cholestryramine. Patient reports a history of menopausal symptoms, overactive bladder, osteoarthritis, cholecystectomy, and allergic rhinitis.  Patient does not currently have a primary care provider. Patient denies needing any other refills at this time. Patient denies any side/adverse effects of this medication and tolerates it very well.  Patient is taking this due to history of a cholecystectomy followed by diarrhea.  Patient reports his medication is very effective in controlling her diarrhea and that this medication was originally prescribed by gastro who recommended she continue taking this.  PCP: None currently. Patient denies any other issues or concerns.   Review of Systems Unremarkable  Objective  Physical Exam General: Awake, alert and oriented. No acute distress. Well developed, hydrated and nourished. Appears stated age.  HEENT: Supple neck without adenopathy. Sclera is non-icteric. The ear canal is clear without discharge. The tympanic membrane is normal in appearance with normal landmarks and cone of light. Nasal mucosa is pink and moist. Oral mucosa is pink and moist. The pharynx is normal in appearance without tonsillar swelling or exudates.  Skin: Skin in warm, dry and intact without rashes or lesions. Appropriate color for ethnicity. Cardiac: Heart rate and rhythm are normal. No murmurs, gallops, or rubs are auscultated.  Respiratory: The chest wall is symmetric and without deformity. No signs of respiratory distress. Lung sounds are clear in all lobes bilaterally without rales, ronchi, or wheezes.  Abdomen: soft, non-tender; bowel sounds normal; no masses,  no organomegaly. Neurological: The patient is awake, alert and oriented to person, place, and time with normal speech.  Memory is normal and thought processes intact. No gait abnormalities are appreciated.  Psychiatric: Appropriate mood and affect.    Assessment Medication refill  Plan  Patient declined routine lab work. Encouraged routine visits with primary care provider.  Provided patient with a list of local resources and encouraged her to establish care with a new primary care provider within the next week.  Provided patient with 2 refills for cholestyramine (written prescription per patient request) so that she does not run out of this medication prior to her first appointment establishing care with a primary care provider.  Patient's blood pressure is 144/69 today.  Discussed normal values.  Advised patient to monitor this daily and report abnormal values to her primary care provider.

## 2018-07-04 ENCOUNTER — Other Ambulatory Visit: Payer: Self-pay | Admitting: Obstetrics & Gynecology

## 2018-07-04 DIAGNOSIS — N951 Menopausal and female climacteric states: Secondary | ICD-10-CM

## 2018-07-04 NOTE — Telephone Encounter (Signed)
Annual in June-July; Rx refills done

## 2018-07-07 NOTE — Telephone Encounter (Signed)
Patient is schedule 08/28/18

## 2018-08-28 ENCOUNTER — Ambulatory Visit (INDEPENDENT_AMBULATORY_CARE_PROVIDER_SITE_OTHER): Payer: Managed Care, Other (non HMO) | Admitting: Obstetrics & Gynecology

## 2018-08-28 ENCOUNTER — Encounter: Payer: Self-pay | Admitting: Obstetrics & Gynecology

## 2018-08-28 ENCOUNTER — Other Ambulatory Visit: Payer: Self-pay

## 2018-08-28 VITALS — BP 130/80 | Ht 63.0 in | Wt 208.0 lb

## 2018-08-28 DIAGNOSIS — N951 Menopausal and female climacteric states: Secondary | ICD-10-CM

## 2018-08-28 DIAGNOSIS — Z79890 Hormone replacement therapy (postmenopausal): Secondary | ICD-10-CM | POA: Insufficient documentation

## 2018-08-28 DIAGNOSIS — Z01419 Encounter for gynecological examination (general) (routine) without abnormal findings: Secondary | ICD-10-CM | POA: Diagnosis not present

## 2018-08-28 DIAGNOSIS — Z1239 Encounter for other screening for malignant neoplasm of breast: Secondary | ICD-10-CM

## 2018-08-28 MED ORDER — OXYBUTYNIN CHLORIDE ER 10 MG PO TB24: 10.0000 mg | ORAL_TABLET | Freq: Every day | ORAL | 3 refills | Status: DC

## 2018-08-28 MED ORDER — PROGESTERONE MICRONIZED 100 MG PO CAPS: 100.0000 mg | ORAL_CAPSULE | Freq: Every day | ORAL | 3 refills | Status: DC

## 2018-08-28 MED ORDER — ESTRADIOL 1 MG PO TABS: 1.0000 mg | ORAL_TABLET | Freq: Every day | ORAL | 3 refills | Status: DC

## 2018-08-28 NOTE — Patient Instructions (Signed)
PAP every three years Mammogram every year    Call [phone] to schedule at [facility] Colonoscopy every 10 years Labs yearly (with PCP)   

## 2018-08-28 NOTE — Progress Notes (Signed)
HPI:      [name] is a 58 y.o. G0P0000 who LMP was in the past, she presents today for her annual examination.  The patient has no complaints today.  No hot flashes and improved mood on HRT.  Bladder sx's in past were overflow incontinence more so than urge/freq, but Ditropan has helped w that as well. The patient is sexually active. Herlast pap: approximate date 2018 and was normal and last mammogram: approximate date 2018 and was normal.  The patient does perform self breast exams.  There is no notable family history of breast or ovarian cancer in her family. The patient is taking hormone replacement therapy. Patient denies post-menopausal vaginal bleeding.   The patient has regular exercise: yes. The patient denies current symptoms of depression.    GYN Hx: Last Colonoscopy:6 yrs ago. Normal.  Last DEXA: never ago.    PMHx: Past Medical History:  Diagnosis Date  . Joint stiffness   . Overactive bladder   . Urinary incontinence    Past Surgical History:  Procedure Laterality Date  . CHOLECYSTECTOMY    . CRYOTHERAPY    . FOOT SURGERY    . SHOULDER SURGERY     Family History  Problem Relation Age of Onset  . Thyroid disease Mother   . Heart failure Father   . Diabetes Father   . Diabetes Paternal Aunt   . Breast cancer Neg Hx    Social History   Tobacco Use  . Smoking status: Never Smoker  . Smokeless tobacco: Never Used  Substance Use Topics  . Alcohol use: Yes    Alcohol/week: 0.0 standard drinks  . Drug use: No    Current Outpatient Medications:  .  cholestyramine (QUESTRAN) 4 g packet, Take one packet by mouth twice daily, Disp: 60 each, Rfl: 2 .  etodolac (LODINE) 400 MG tablet, TAKE ONE (1) TABLET BY MOUTH TWO (2) TIMES DAILY, Disp: 60 tablet, Rfl: 6 .  fluticasone (FLONASE) 50 MCG/ACT nasal spray, 2 sprays by Each Nare  daily. (Patient taking differently: as needed. 2 sprays by Each Nare  daily.), Disp: 16 g, Rfl: 6 .  oxybutynin (DITROPAN-XL) 10 MG 24  hr tablet, Take 1 tablet (10 mg total) by mouth at bedtime., Disp: 90 tablet, Rfl: 3 .  progesterone (PROMETRIUM) 100 MG capsule, Take 1 capsule (100 mg total) by mouth daily., Disp: 90 capsule, Rfl: 3 .  vitamin C (ASCORBIC ACID) 500 MG tablet, Take 500 mg by mouth daily., Disp: , Rfl:  .  Vitamin D, Ergocalciferol, (DRISDOL) 50000 units CAPS capsule, TAKE ONE CAPSULE BY MOUTH ONCE A WEEK, Disp: 12 capsule, Rfl: 3 .  estradiol (ESTRACE) 1 MG tablet, Take 1 tablet (1 mg total) by mouth daily., Disp: 90 tablet, Rfl: 3 .  ranitidine (ZANTAC) 150 MG tablet, TAKE ONE (1) TABLET BY MOUTH TWO (2) TIMES DAILY (Patient not taking: Reported on 08/28/2018), Disp: 60 tablet, Rfl: 6 Allergies: Sulfa antibiotics  Review of Systems  Constitutional: Negative for chills, fever and malaise/fatigue.  HENT: Negative for congestion, sinus pain and sore throat.   Eyes: Negative for blurred vision and pain.  Respiratory: Negative for cough and wheezing.   Cardiovascular: Negative for chest pain and leg swelling.  Gastrointestinal: Negative for abdominal pain, constipation, diarrhea, heartburn, nausea and vomiting.  Genitourinary: Negative for dysuria, frequency, hematuria and urgency.  Musculoskeletal: Negative for back pain, joint pain, myalgias and neck pain.  Skin: Negative for itching and rash.  Neurological: Negative for  dizziness, tremors and weakness.  Endo/Heme/Allergies: Does not bruise/bleed easily.  Psychiatric/Behavioral: Negative for depression. The patient is not nervous/anxious and does not have insomnia.     Objective: BP 130/80   Ht 5\' 3"  (1.6 m)   Wt 208 lb (94.3 kg)   BMI 36.85 kg/m   Filed Weights   08/28/18 0808  Weight: 208 lb (94.3 kg)   Body mass index is 36.85 kg/m. Physical Exam Constitutional:      General: She is not in acute distress.    Appearance: She is well-developed.  Genitourinary:     Pelvic exam was performed with patient supine.     Vagina, uterus and rectum  normal.     No lesions in the vagina.     No vaginal bleeding.     No cervical motion tenderness, friability, lesion or polyp.     Uterus is mobile.     Uterus is not enlarged.     No uterine mass detected.    Uterus is midaxial.     No right or left adnexal mass present.     Right adnexa not tender.     Left adnexa not tender.  HENT:     Head: Normocephalic and atraumatic. No laceration.     Right Ear: Hearing normal.     Left Ear: Hearing normal.     Mouth/Throat:     Pharynx: Uvula midline.  Eyes:     Pupils: Pupils are equal, round, and reactive to light.  Neck:     Musculoskeletal: Normal range of motion and neck supple.     Thyroid: No thyromegaly.  Cardiovascular:     Rate and Rhythm: Normal rate and regular rhythm.     Heart sounds: No murmur. No friction rub. No gallop.   Pulmonary:     Effort: Pulmonary effort is normal. No respiratory distress.     Breath sounds: Normal breath sounds. No wheezing.  Chest:     Breasts:        Right: No mass, skin change or tenderness.        Left: No mass, skin change or tenderness.  Abdominal:     General: Bowel sounds are normal. There is no distension.     Palpations: Abdomen is soft.     Tenderness: There is no abdominal tenderness. There is no rebound.  Musculoskeletal: Normal range of motion.  Neurological:     Mental Status: She is alert and oriented to person, place, and time.     Cranial Nerves: No cranial nerve deficit.  Skin:    General: Skin is warm and dry.  Psychiatric:        Judgment: Judgment normal.  Vitals signs reviewed.     Assessment: Annual Exam 1. Women's annual routine gynecological examination   2. Screening for breast cancer   3. Hormone replacement therapy   4. Menopausal hot flushes     Plan:            1.  Cervical Screening-  Pap smear schedule reviewed with patient  2. Breast screening- Exam annually and mammogram scheduled  3. Colonoscopy every 10 years, Hemoccult testing after  age 70  4. Labs managed by PCP  5. Counseling for hormonal therapy: wants to change HRT or dose due to moodiness, will taper dose and eventually discontinue Estrace and Prometrium  Also discussed Ditropan use and possible ability to discontinue as well  Future risks fro vag atrophy and need for therapy discussed  6. FRAX - FRAX score for assessing the 10 year probability for fracture calculated and discussed today.  Based on age and score today, DEXA is not currently scheduled.    F/U  Return in about 1 year (around 08/28/2019) for Annual.  Barnett Applebaum, MD, [facility], [facility] 08/28/2018  8:53 AM

## 2018-11-04 ENCOUNTER — Other Ambulatory Visit: Payer: Self-pay | Admitting: Obstetrics & Gynecology

## 2018-11-04 DIAGNOSIS — Z1239 Encounter for other screening for malignant neoplasm of breast: Secondary | ICD-10-CM

## 2019-03-20 ENCOUNTER — Ambulatory Visit
Admission: RE | Admit: 2019-03-20 | Discharge: 2019-03-20 | Disposition: A | Discharge status: Home / Self Care | Payer: Managed Care, Other (non HMO) | Source: Ambulatory Visit | Attending: Obstetrics & Gynecology | Admitting: Obstetrics & Gynecology

## 2019-03-20 DIAGNOSIS — Z1231 Encounter for screening mammogram for malignant neoplasm of breast: Secondary | ICD-10-CM | POA: Diagnosis not present

## 2019-03-20 DIAGNOSIS — Z1239 Encounter for other screening for malignant neoplasm of breast: Secondary | ICD-10-CM

## 2019-03-23 ENCOUNTER — Other Ambulatory Visit: Payer: Self-pay | Admitting: Obstetrics & Gynecology

## 2019-03-23 DIAGNOSIS — R928 Other abnormal and inconclusive findings on diagnostic imaging of breast: Secondary | ICD-10-CM

## 2019-03-23 DIAGNOSIS — N6310 Unspecified lump in the right breast, unspecified quadrant: Secondary | ICD-10-CM

## 2019-03-24 ENCOUNTER — Ambulatory Visit
Admission: RE | Admit: 2019-03-24 | Discharge: 2019-03-24 | Disposition: A | Discharge status: Home / Self Care | Payer: Managed Care, Other (non HMO) | Source: Ambulatory Visit | Attending: Obstetrics & Gynecology | Admitting: Obstetrics & Gynecology

## 2019-03-24 DIAGNOSIS — N6310 Unspecified lump in the right breast, unspecified quadrant: Secondary | ICD-10-CM | POA: Diagnosis present

## 2019-03-24 DIAGNOSIS — R928 Other abnormal and inconclusive findings on diagnostic imaging of breast: Secondary | ICD-10-CM | POA: Diagnosis not present

## 2019-08-05 ENCOUNTER — Telehealth: Payer: Self-pay

## 2019-08-05 NOTE — Telephone Encounter (Signed)
Pt calling triage today wanting to speak with Dr. Kenton Kingfisher before her next appt in July. Wants to discuss coming off the hormones. Not seeing much relief with her symptoms. Patient having a lot of anxiety. Tired all the time. Feels stressed. Wondering if she needs to talk to PCP vs Dr Kenton Kingfisher for this issue. Please advise. Pt aware you are out of office today but will return tomorrow. Please send to J. Sharlett Iles as I am out of the office the rest of the week. Pt is aware she will be calling her with advise.

## 2019-08-06 ENCOUNTER — Other Ambulatory Visit: Payer: Self-pay | Admitting: Obstetrics & Gynecology

## 2019-08-06 MED ORDER — CLONIDINE HCL 0.1 MG PO TABS: 0.1000 mg | ORAL_TABLET | Freq: Every day | ORAL | 1 refills | Status: DC

## 2019-08-06 NOTE — Telephone Encounter (Signed)
This was discussed last year and it is OK for her to taper down off of hormones if she would like.  Can discuss further at appointment as there are a lot of variables to discuss based on her symptoms. If she wants to come off of them prior to visit then OK to take every other day for 2 weeks then stop/

## 2019-08-06 NOTE — Telephone Encounter (Signed)
I can certainly help address these sx's.  Will call in medicine to try to bridge these menopause type sx's.  Will discuss further in person at appointment.  Clonidine one pill daily.  ERx.

## 2019-08-06 NOTE — Telephone Encounter (Signed)
Pt aware.

## 2019-08-06 NOTE — Telephone Encounter (Signed)
She has been tapering off but would need something else because still has symptoms. Anger, frustration, pt feels ill very easy, loosing her hair for 3 months, extreme fatigue mood swings, should she speak with her PCP or you?

## 2019-08-11 ENCOUNTER — Ambulatory Visit: Payer: Managed Care, Other (non HMO) | Admitting: Podiatry

## 2019-08-11 ENCOUNTER — Other Ambulatory Visit: Payer: Self-pay

## 2019-08-11 DIAGNOSIS — M79676 Pain in unspecified toe(s): Secondary | ICD-10-CM

## 2019-08-11 DIAGNOSIS — B351 Tinea unguium: Secondary | ICD-10-CM

## 2019-08-11 DIAGNOSIS — M79609 Pain in unspecified limb: Secondary | ICD-10-CM

## 2019-08-16 NOTE — Progress Notes (Signed)
  Subjective:  Patient ID: Kristy Boyd, female    DOB: 12-02-1960,  MRN: [id number]  Chief Complaint  Patient presents with  . debride    BL toenials (1-10), growing in, curling, pinched toenails and thickening x  Few mo; with soreness at Rt 3rd toenail Tx: OTC fungal tx    59 y.o. female presents with the above complaint. History confirmed with patient.   Objective:  Physical Exam: warm, good capillary refill, nails thickened and dystrophic, normal DP and PT pulses and normal sensory exam. Left Foot: normal exam, no swelling, tenderness, instability; ligaments intact, full range of motion of all ankle/foot joints  Right Foot: normal exam, no swelling, tenderness, instability; ligaments intact, full range of motion of all ankle/foot joints    Assessment:   1. Pain due to onychomycosis of nail    Plan:  Patient was evaluated and treated and all questions answered.  Onychomycosis  -Nails palliatively debrided secondary to pain  Procedure: Nail Debridement Rationale: pain Type of Debridement: manual, sharp debridement. Instrumentation: Nail nipper, rotary burr. Number of Nails: 10  No follow-ups on file.

## 2019-08-26 ENCOUNTER — Other Ambulatory Visit: Payer: Self-pay | Admitting: Obstetrics & Gynecology

## 2019-09-01 ENCOUNTER — Other Ambulatory Visit: Payer: Self-pay

## 2019-09-01 ENCOUNTER — Other Ambulatory Visit (HOSPITAL_COMMUNITY)
Admission: RE | Admit: 2019-09-01 | Discharge: 2019-09-01 | Disposition: A | Discharge status: Home / Self Care | Payer: Managed Care, Other (non HMO) | Source: Ambulatory Visit | Attending: Obstetrics & Gynecology | Admitting: Obstetrics & Gynecology

## 2019-09-01 ENCOUNTER — Ambulatory Visit (INDEPENDENT_AMBULATORY_CARE_PROVIDER_SITE_OTHER): Payer: Managed Care, Other (non HMO) | Admitting: Obstetrics & Gynecology

## 2019-09-01 ENCOUNTER — Encounter: Payer: Self-pay | Admitting: Obstetrics & Gynecology

## 2019-09-01 VITALS — BP 120/80 | Ht 63.0 in | Wt 210.0 lb

## 2019-09-01 DIAGNOSIS — Z1231 Encounter for screening mammogram for malignant neoplasm of breast: Secondary | ICD-10-CM

## 2019-09-01 DIAGNOSIS — Z01419 Encounter for gynecological examination (general) (routine) without abnormal findings: Secondary | ICD-10-CM | POA: Diagnosis not present

## 2019-09-01 DIAGNOSIS — N951 Menopausal and female climacteric states: Secondary | ICD-10-CM | POA: Diagnosis not present

## 2019-09-01 DIAGNOSIS — Z124 Encounter for screening for malignant neoplasm of cervix: Secondary | ICD-10-CM | POA: Diagnosis not present

## 2019-09-01 MED ORDER — OXYBUTYNIN CHLORIDE ER 10 MG PO TB24: 10.0000 mg | ORAL_TABLET | Freq: Every day | ORAL | 3 refills | Status: DC

## 2019-09-01 MED ORDER — CLONIDINE HCL 0.1 MG PO TABS: 0.1000 mg | ORAL_TABLET | Freq: Every day | ORAL | 3 refills | Status: DC

## 2019-09-01 NOTE — Progress Notes (Signed)
HPI:      [name] is a 59 y.o. G0P0000 who LMP was in the past, she presents today for her annual examination.  The patient has no complaints today. The patient is sexually active. Herlast pap: approximate date 2018 and was normal and last mammogram: approximate date 2021 (Jan) and was normal.  The patient does perform self breast exams.  There is no notable family history of breast or ovarian cancer in her family. The patient is not currently taking hormone replacement therapy. Patient denies post-menopausal vaginal bleeding.   The patient has regular exercise: yes. The patient denies current symptoms of depression.    GYN Hx: Last Colonoscopy:7 years ago. Normal.  Last DEXA: never ago.    PMHx: Past Medical History:  Diagnosis Date  . Joint stiffness   . Overactive bladder   . Urinary incontinence    Past Surgical History:  Procedure Laterality Date  . CHOLECYSTECTOMY    . CRYOTHERAPY    . EYE SURGERY    . FOOT SURGERY    . SHOULDER SURGERY     Family History  Problem Relation Age of Onset  . Thyroid disease Mother   . Heart failure Father   . Diabetes Father   . Diabetes Paternal Aunt   . Breast cancer Neg Hx    Social History   Tobacco Use  . Smoking status: Never Smoker  . Smokeless tobacco: Never Used  Vaping Use  . Vaping Use: Never used  Substance Use Topics  . Alcohol use: Yes    Alcohol/week: 0.0 standard drinks  . Drug use: No    Current Outpatient Medications:  .  cholestyramine (QUESTRAN) 4 g packet, Take one packet by mouth twice daily, Disp: 60 each, Rfl: 2 .  cloNIDine (CATAPRES) 0.1 MG tablet, Take 1 tablet (0.1 mg total) by mouth daily., Disp: 30 tablet, Rfl: 1 .  etodolac (LODINE) 400 MG tablet, TAKE ONE (1) TABLET BY MOUTH TWO (2) TIMES DAILY, Disp: 60 tablet, Rfl: 6 .  famotidine (PEPCID) 20 MG tablet, Take 20 mg by mouth 2 (two) times daily., Disp: , Rfl:  .  oxybutynin (DITROPAN-XL) 10 MG 24 hr tablet, TAKE 1 TABLET BY MOUTH AT  BEDTIME, Disp: 90 tablet, Rfl: 0 .  vitamin C (ASCORBIC ACID) 500 MG tablet, Take 500 mg by mouth daily., Disp: , Rfl:  .  estradiol (ESTRACE) 0.1 MG/GM vaginal cream, Place 1 Applicatorful vaginally at bedtime. (Patient not taking: Reported on 09/01/2019), Disp: , Rfl:  .  finasteride (PROSCAR) 5 MG tablet, SMARTSIG:.5 Tablet(s) By Mouth Daily (Patient not taking: Reported on 09/01/2019), Disp: , Rfl:  Allergies: Sulfa antibiotics  Review of Systems  Constitutional: Positive for malaise/fatigue. Negative for chills and fever.  HENT: Negative for congestion, sinus pain and sore throat.   Eyes: Negative for blurred vision and pain.  Respiratory: Negative for cough and wheezing.   Cardiovascular: Negative for chest pain and leg swelling.  Gastrointestinal: Negative for abdominal pain, constipation, diarrhea, heartburn, nausea and vomiting.  Genitourinary: Positive for frequency. Negative for dysuria, hematuria and urgency.  Musculoskeletal: Positive for joint pain. Negative for back pain, myalgias and neck pain.  Skin: Negative for itching and rash.  Neurological: Negative for dizziness, tremors and weakness.  Endo/Heme/Allergies: Does not bruise/bleed easily.  Psychiatric/Behavioral: Negative for depression. The patient is nervous/anxious. The patient does not have insomnia.     Objective: BP 120/80   Ht 5\' 3"  (1.6 m)   Wt 210 lb (95.3 kg)  BMI 37.20 kg/m   Filed Weights   09/01/19 0758  Weight: 210 lb (95.3 kg)   Body mass index is 37.2 kg/m. Physical Exam Constitutional:      General: She is not in acute distress.    Appearance: She is well-developed.  Genitourinary:     Pelvic exam was performed with patient supine.     Vagina, uterus and rectum normal.     No lesions in the vagina.     No vaginal bleeding.     No cervical motion tenderness, friability, lesion or polyp.     Uterus is mobile.     Uterus is not enlarged.     No uterine mass detected.    Uterus is midaxial.       No right or left adnexal mass present.     Right adnexa not tender.     Left adnexa not tender.  HENT:     Head: Normocephalic and atraumatic. No laceration.     Right Ear: Hearing normal.     Left Ear: Hearing normal.     Mouth/Throat:     Pharynx: Uvula midline.  Eyes:     Pupils: Pupils are equal, round, and reactive to light.  Neck:     Thyroid: No thyromegaly.  Cardiovascular:     Rate and Rhythm: Normal rate and regular rhythm.     Heart sounds: No murmur heard.  No friction rub. No gallop.   Pulmonary:     Effort: Pulmonary effort is normal. No respiratory distress.     Breath sounds: Normal breath sounds. No wheezing.  Chest:     Breasts:        Right: No mass, skin change or tenderness.        Left: No mass, skin change or tenderness.  Abdominal:     General: Bowel sounds are normal. There is no distension.     Palpations: Abdomen is soft.     Tenderness: There is no abdominal tenderness. There is no rebound.  Musculoskeletal:        General: Normal range of motion.     Cervical back: Normal range of motion and neck supple.  Neurological:     Mental Status: She is alert and oriented to person, place, and time.     Cranial Nerves: No cranial nerve deficit.  Skin:    General: Skin is warm and dry.  Psychiatric:        Judgment: Judgment normal.  Vitals reviewed.     Assessment: Annual Exam 1. Women's annual routine gynecological examination   2. Encounter for screening mammogram for malignant neoplasm of breast   3. Menopausal hot flushes   4. Screening for cervical cancer     Plan:            1.  Cervical Screening-  Pap smear done today  2. Breast screening- Exam annually and mammogram scheduled  3. Colonoscopy every 10 years, Hemoccult testing after age 46  4. Labs managed by PCP  5. Counseling for hormonal therapy: no change in therapy today- she will cont Clonidine oral therapy No longer taking estrogen vaginal cream Monitor sx's, may  adjust dose of clonidine or change to patch as desired by pt              6. FRAX - FRAX score for assessing the 10 year probability for fracture calculated and discussed today.  Based on age and score today, DEXA is not currently scheduled.  F/U  Return in about 1 year (around 08/31/2020) for Annual.  Barnett Applebaum, MD, [facility], [facility] 09/01/2019  8:03 AM

## 2019-09-01 NOTE — Patient Instructions (Addendum)
PAP every three years Mammogram every year    Call [phone] to schedule at [facility] (due in Jan) Colonoscopy every 10 years Labs yearly (with PCP)  Thank you for choosing [facility]. As part of our ongoing efforts to improve patient experience, we would appreciate your feedback. Please fill out the short survey that you will receive by mail or MyChart. Your opinion is important to [place]!

## 2019-09-02 LAB — CYTOLOGY - PAP
Adequacy: ABSENT
Comment: NEGATIVE
Diagnosis: NEGATIVE
High risk HPV: NEGATIVE

## 2020-09-30 NOTE — Telephone Encounter (Signed)
Pt calling for mammogram order to be put in for [facility] so she can schedule her appt for after her appt with [place].  [phone]

## 2020-10-03 ENCOUNTER — Other Ambulatory Visit: Payer: Self-pay | Admitting: Obstetrics & Gynecology

## 2020-10-03 DIAGNOSIS — Z1231 Encounter for screening mammogram for malignant neoplasm of breast: Secondary | ICD-10-CM

## 2020-10-03 NOTE — Telephone Encounter (Signed)
Order is there in [place] and has been.  I have double checked and re-ordered to be sure

## 2020-10-07 ENCOUNTER — Other Ambulatory Visit: Payer: Self-pay

## 2020-10-07 ENCOUNTER — Encounter: Payer: Self-pay | Admitting: Obstetrics & Gynecology

## 2020-10-07 ENCOUNTER — Ambulatory Visit (INDEPENDENT_AMBULATORY_CARE_PROVIDER_SITE_OTHER): Payer: Managed Care, Other (non HMO) | Admitting: Obstetrics & Gynecology

## 2020-10-07 VITALS — BP 110/70 | Ht 63.0 in | Wt 198.0 lb

## 2020-10-07 DIAGNOSIS — Z01419 Encounter for gynecological examination (general) (routine) without abnormal findings: Secondary | ICD-10-CM | POA: Diagnosis not present

## 2020-10-07 DIAGNOSIS — N3281 Overactive bladder: Secondary | ICD-10-CM | POA: Diagnosis not present

## 2020-10-07 DIAGNOSIS — Z1211 Encounter for screening for malignant neoplasm of colon: Secondary | ICD-10-CM | POA: Diagnosis not present

## 2020-10-07 MED ORDER — OXYBUTYNIN CHLORIDE ER 10 MG PO TB24: 10.0000 mg | ORAL_TABLET | Freq: Every day | ORAL | 3 refills | Status: DC

## 2020-10-07 NOTE — Progress Notes (Signed)
HPI:      [name] is a 60 y.o. G0P0000 who LMP was in the past, she presents today for her annual examination.  The patient has no complaints today. The patient is sexually active. Herlast pap: approximate date 2021 and was normal and last mammogram: approximate date 2021 and was normal.  The patient does perform self breast exams.  There is no notable family history of breast or ovarian cancer in her family. The patient is not taking hormone replacement therapy. Patient denies post-menopausal vaginal bleeding.   The patient has regular exercise: yes. The patient denies current symptoms of depression.    GYN Hx: Last Colonoscopy: 9 years  ago. Normal.  Last DEXA:  never  ago.    PMHx: Past Medical History:  Diagnosis Date   Joint stiffness    Overactive bladder    Urinary incontinence    Past Surgical History:  Procedure Laterality Date   CHOLECYSTECTOMY     CRYOTHERAPY     EYE SURGERY     FOOT SURGERY     SHOULDER SURGERY     Family History  Problem Relation Age of Onset   Thyroid disease Mother    Heart failure Father    Diabetes Father    Diabetes Paternal Aunt    Breast cancer Neg Hx    Social History   Tobacco Use   Smoking status: Never   Smokeless tobacco: Never  Vaping Use   Vaping Use: Never used  Substance Use Topics   Alcohol use: Yes    Alcohol/week: 0.0 standard drinks   Drug use: No    Current Outpatient Medications:    cholestyramine (QUESTRAN) 4 g packet, Take one packet by mouth twice daily, Disp: 60 each, Rfl: 2   etodolac (LODINE) 400 MG tablet, TAKE ONE (1) TABLET BY MOUTH TWO (2) TIMES DAILY, Disp: 60 tablet, Rfl: 6   famotidine (PEPCID) 20 MG tablet, Take 20 mg by mouth 2 (two) times daily., Disp: , Rfl:    finasteride (PROSCAR) 5 MG tablet, SMARTSIG:.5 Tablet(s) By Mouth Daily, Disp: , Rfl:    vitamin C (ASCORBIC ACID) 500 MG tablet, Take 500 mg by mouth daily., Disp: , Rfl:    oxybutynin (DITROPAN-XL) 10 MG 24 hr tablet, Take 1  tablet (10 mg total) by mouth at bedtime., Disp: 90 tablet, Rfl: 3 Allergies: Sulfa antibiotics  Review of Systems  Constitutional:  Negative for chills, fever and malaise/fatigue.  HENT:  Negative for congestion, sinus pain and sore throat.   Eyes:  Negative for blurred vision and pain.  Respiratory:  Negative for cough and wheezing.   Cardiovascular:  Negative for chest pain and leg swelling.  Gastrointestinal:  Negative for abdominal pain, constipation, diarrhea, heartburn, nausea and vomiting.  Genitourinary:  Positive for urgency. Negative for dysuria, frequency and hematuria.  Musculoskeletal:  Negative for back pain, joint pain, myalgias and neck pain.  Skin:  Negative for itching and rash.  Neurological:  Negative for dizziness, tremors and weakness.  Endo/Heme/Allergies:  Does not bruise/bleed easily.  Psychiatric/Behavioral:  Negative for depression. The patient is not nervous/anxious and does not have insomnia.    Objective: BP 110/70   Ht '5\' 3"'$  (1.6 m)   Wt 198 lb (89.8 kg)   BMI 35.07 kg/m   Filed Weights   10/07/20 1011  Weight: 198 lb (89.8 kg)   Body mass index is 35.07 kg/m. Physical Exam Constitutional:      General: She is not in acute  distress.    Appearance: She is well-developed.  Genitourinary:     Bladder, rectum and urethral meatus normal.     No lesions in the vagina.     Right Labia: No rash, tenderness or lesions.    Left Labia: No tenderness, lesions or rash.    No vaginal bleeding.      Right Adnexa: not tender and no mass present.    Left Adnexa: not tender and no mass present.    No cervical motion tenderness, friability, lesion or polyp.     Uterus is not enlarged.     No uterine mass detected.    Pelvic exam was performed with patient in the lithotomy position.  Breasts:    Right: No mass, skin change or tenderness.     Left: No mass, skin change or tenderness.  HENT:     Head: Normocephalic and atraumatic. No laceration.     Right  Ear: Hearing normal.     Left Ear: Hearing normal.     Mouth/Throat:     Pharynx: Uvula midline.  Eyes:     Pupils: Pupils are equal, round, and reactive to light.  Neck:     Thyroid: No thyromegaly.  Cardiovascular:     Rate and Rhythm: Normal rate and regular rhythm.     Heart sounds: No murmur heard.   No friction rub. No gallop.  Pulmonary:     Effort: Pulmonary effort is normal. No respiratory distress.     Breath sounds: Normal breath sounds. No wheezing.  Abdominal:     General: Bowel sounds are normal. There is no distension.     Palpations: Abdomen is soft.     Tenderness: There is no abdominal tenderness. There is no rebound.  Musculoskeletal:        General: Normal range of motion.     Cervical back: Normal range of motion and neck supple.  Neurological:     Mental Status: She is alert and oriented to person, place, and time.     Cranial Nerves: No cranial nerve deficit.  Skin:    General: Skin is warm and dry.  Psychiatric:        Judgment: Judgment normal.  Vitals reviewed.    Assessment: Annual Exam 1. Women's annual routine gynecological examination   2. Screen for colon cancer   3. Overactive bladder     Plan:            1.  Cervical Screening-  Pap smear schedule reviewed with patient, Pap smear to be scheduled2024  2. Breast screening- Exam annually and mammogram scheduled next week  3. Colonoscopy every 10 years and due 2023, Hemoccult testing after age 14  4. Labs managed by PCP  5. Counseling for hormonal therapy: none  6. OAB- plan to cont Oxybutynin \   F/U  Return in about 1 year (around 10/07/2021) for Annual.  Barnett Applebaum, MD, [facility], [facility] 10/07/2020  10:40 AM

## 2020-10-07 NOTE — Patient Instructions (Signed)
PAP every three years Mammogram every year    Call [phone] to schedule at [facility] Colonoscopy every 10 years Labs yearly (with PCP)  Thank you for choosing [facility]. As part of our ongoing efforts to improve patient experience, we would appreciate your feedback. Please fill out the short survey that you will receive by mail or MyChart. Your opinion is important to us! - Dr. Aiyanna Awtrey  Recommendations to boost your immunity to prevent illness such as viral flu and colds, including covid19, are as follows:       - - -  Vitamin K2 and Vitamin D3  - - - Take Vitamin K2 at 200-300 mcg daily (usually 2-3 pills daily of the over the counter formulation). Take Vitamin D3 at 3000-4000 U daily (usually 3-4 pills daily of the over the counter formulation). Studies show that these two at high normal levels in your system are very effective in keeping your immunity so strong and protective that you will be unlikely to contract viral illness such as those listed above.  Dr Virginie Josten  

## 2020-10-24 ENCOUNTER — Ambulatory Visit
Admission: RE | Admit: 2020-10-24 | Discharge: 2020-10-24 | Disposition: A | Discharge status: Home / Self Care | Payer: Managed Care, Other (non HMO) | Source: Ambulatory Visit | Attending: Obstetrics & Gynecology | Admitting: Obstetrics & Gynecology

## 2020-10-24 ENCOUNTER — Other Ambulatory Visit: Payer: Self-pay

## 2020-10-24 DIAGNOSIS — Z1231 Encounter for screening mammogram for malignant neoplasm of breast: Secondary | ICD-10-CM

## 2020-12-13 IMAGING — MG MM DIGITAL DIAGNOSTIC UNILAT*R* W/ TOMO W/ CAD
6 series · 6 of 18 positions shown · non-contrast
Comparison: Previous exam(s).

CLINICAL DATA: Possible mass in the 12 o'clock position of the
right breast on a recent screening mammogram.

EXAM:
DIGITAL DIAGNOSTIC RIGHT MAMMOGRAM WITH CAD AND TOMO
ULTRASOUND RIGHT BREAST

[R MLO synth-2D]
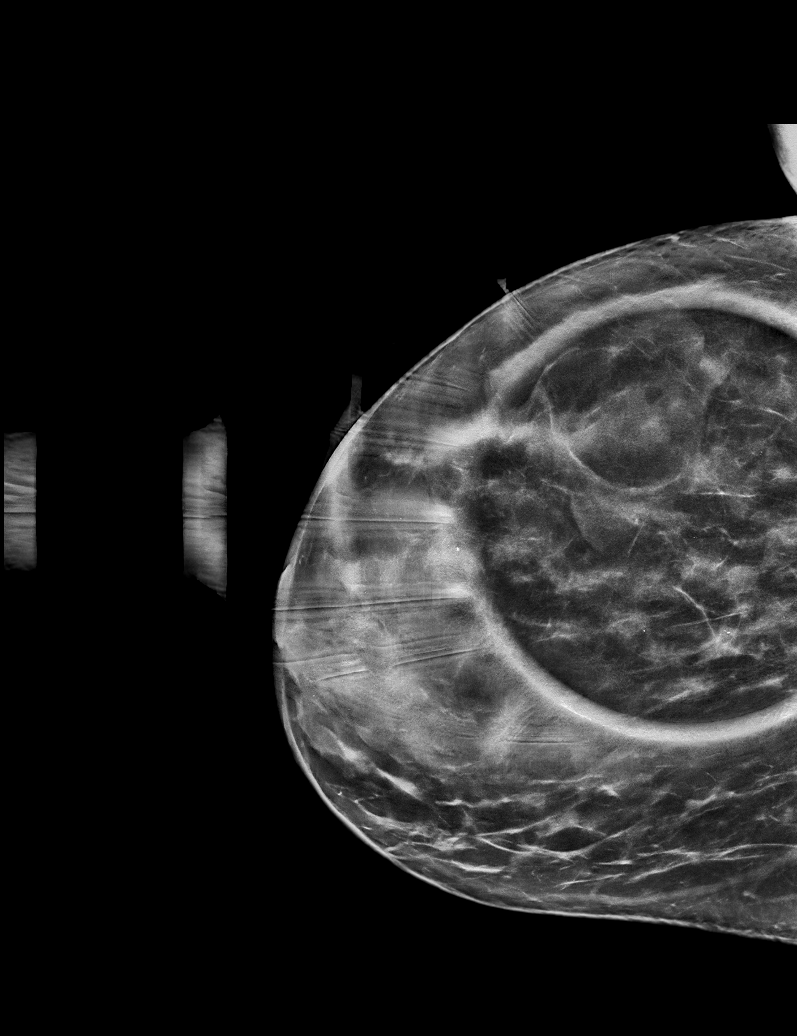

[R CC synth-2D (1 of 2)]
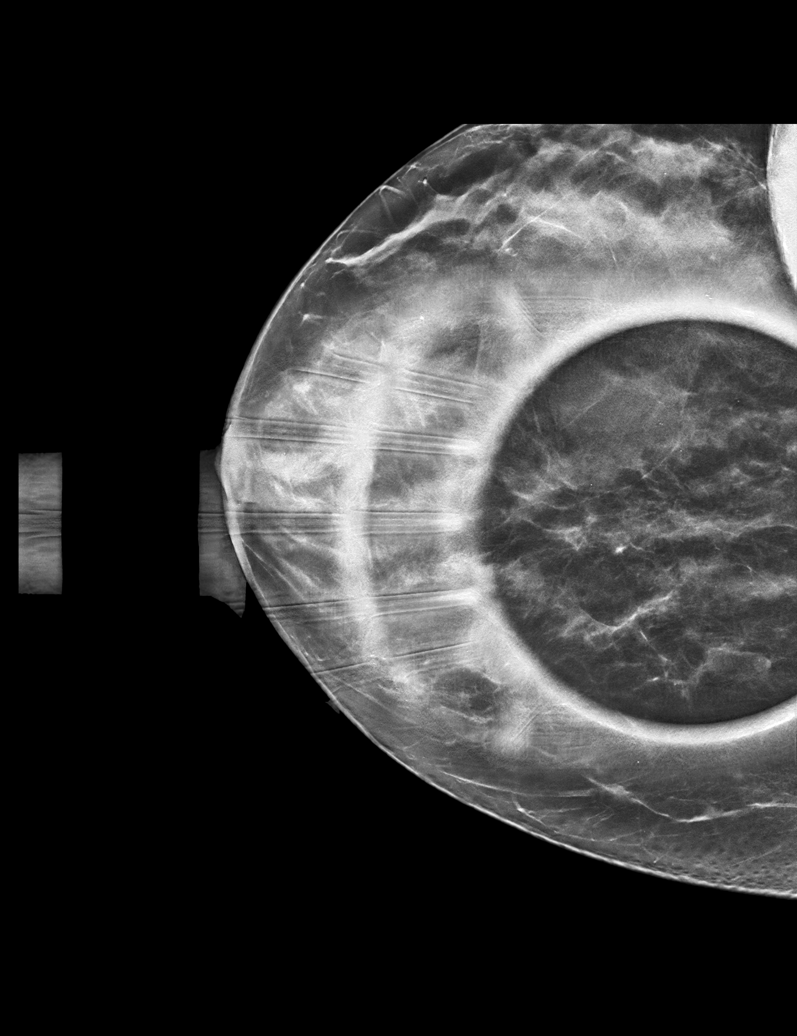

[R CC synth-2D (2 of 2)]
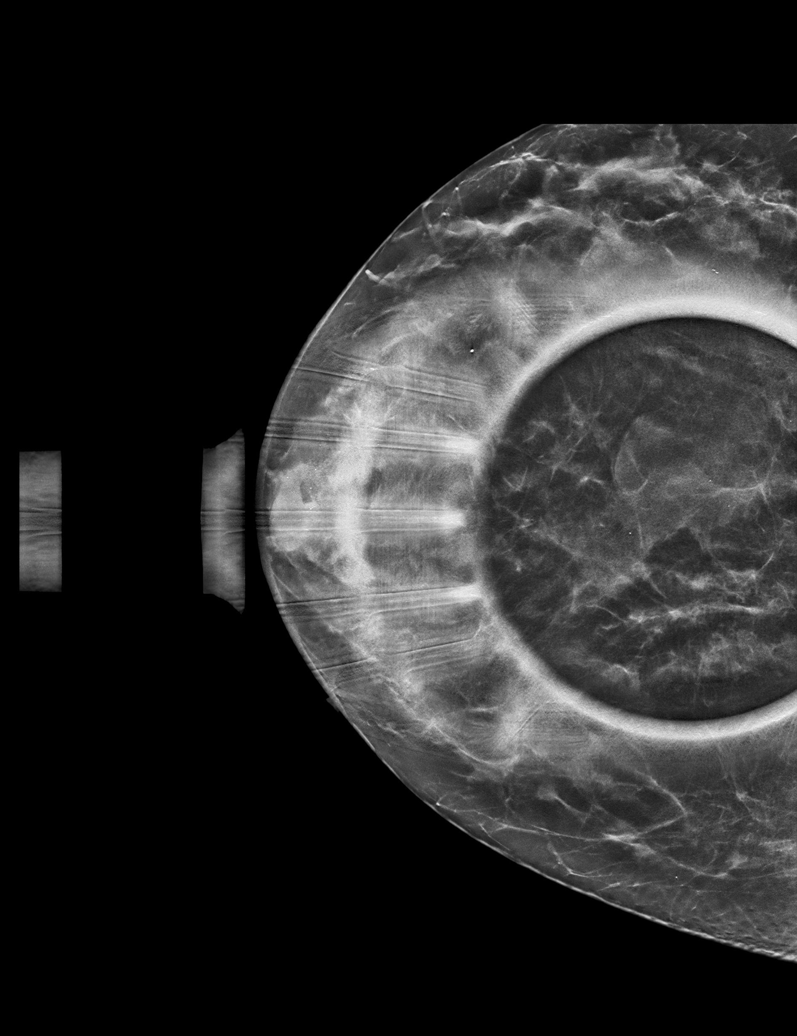

[R CC tomo (1 of 2) · tomo slice 28/55.0]
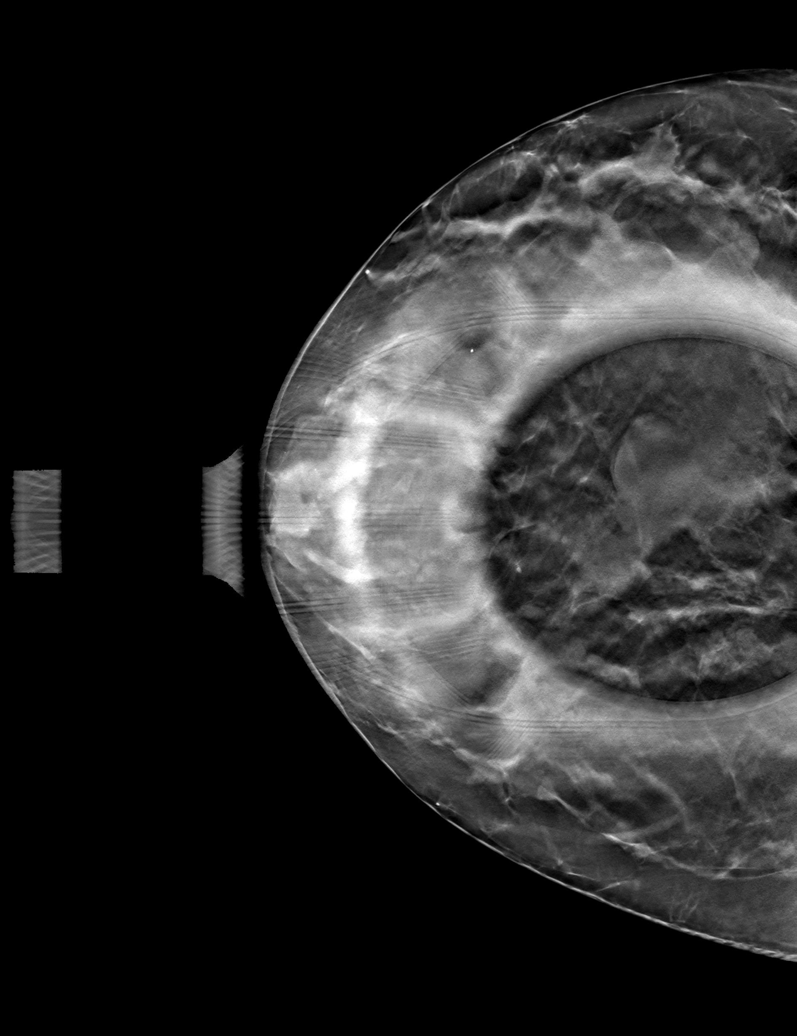

[R MLO tomo · tomo slice 37/73.0]
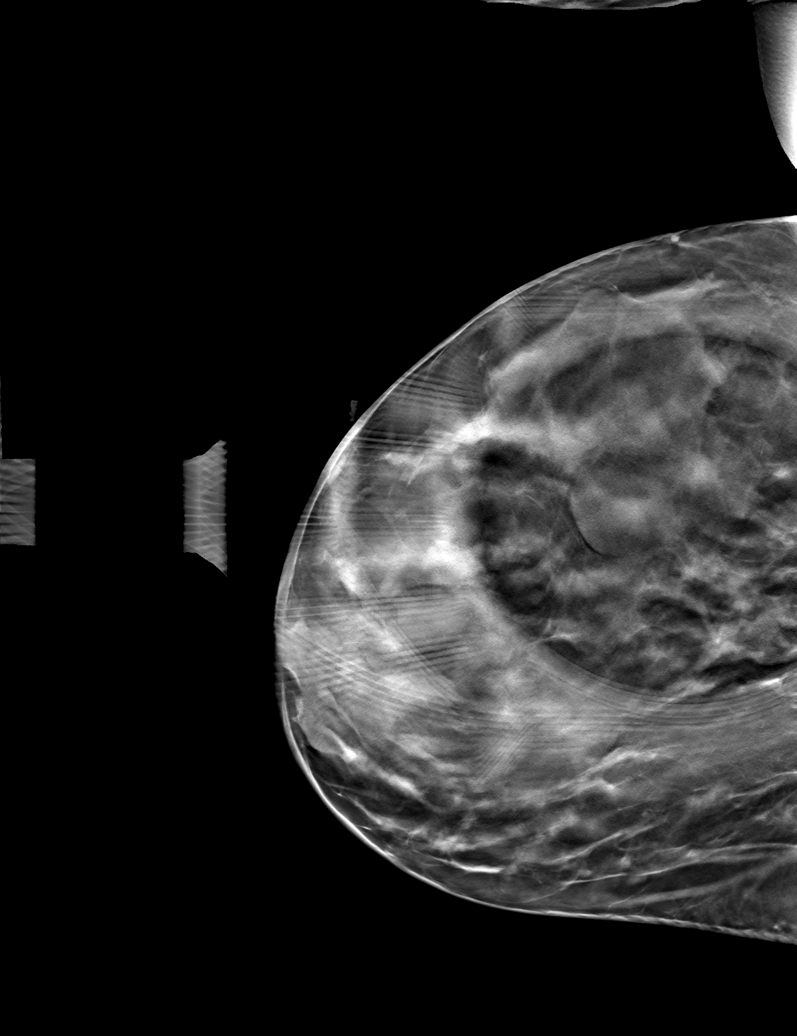

[R CC tomo (2 of 2) · tomo slice 29/57.0]
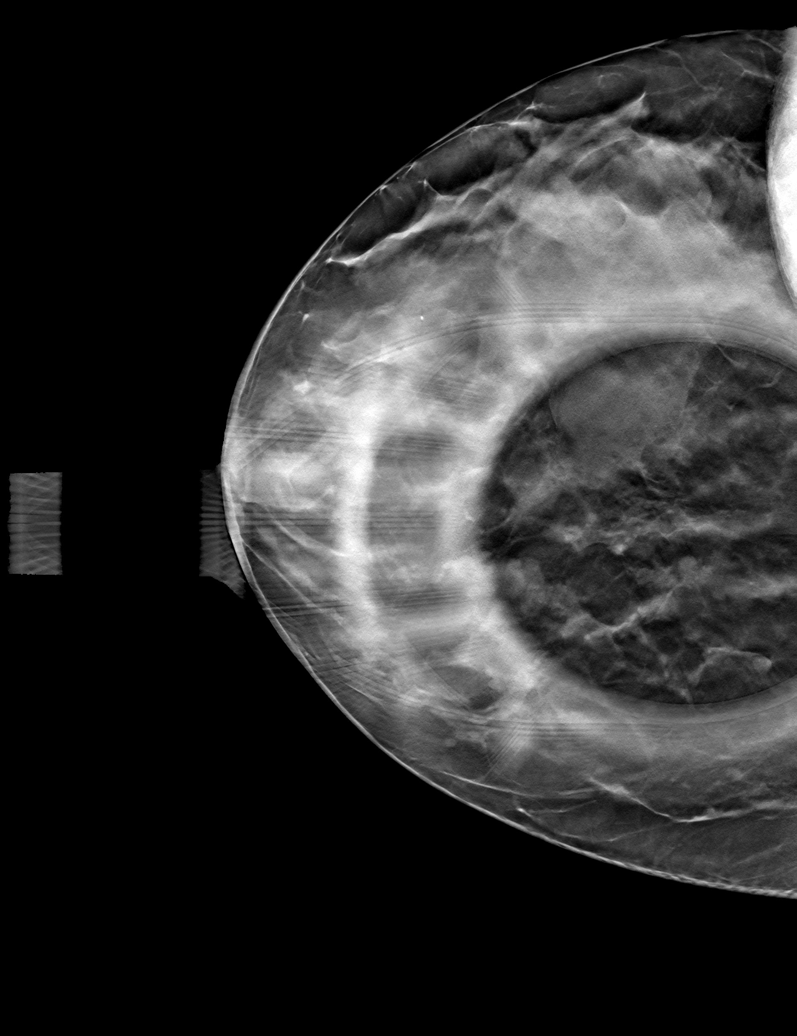

[6 of 18 positions shown; findings below may reference images not displayed]

ACR Breast Density Category c: The breast tissue is heterogeneously
dense, which may obscure small masses.
FINDINGS: 3D tomographic and 2D generated spot compression views the right
breast confirm in approximately 2 cm oval, partially circumscribed
and partially obscured mass in the 12 o'clock position of the right
breast.

Targeted ultrasound is performed, showing a 2.3 cm simple cyst in
the 11:30 o'clock position of the right breast, 5 cm from the
nipple. This corresponds to the mammographic mass. There are
multiple additional smaller cysts in that region of the breast. No
solid masses were seen.
IMPRESSION: Benign right breast cysts.  No evidence of malignancy.

RECOMMENDATION:
Bilateral screening mammogram in 1 year.

I have discussed the findings and recommendations with the patient.
If applicable, a reminder letter will be sent to the patient
regarding the next appointment.

BI-RADS CATEGORY  2: Benign.

## 2021-10-18 NOTE — Progress Notes (Unsigned)
PCP: System, Provider Not In   No chief complaint on file.   HPI:      [name] is a 61 y.o. G0P0000 whose LMP was No LMP recorded. Patient is postmenopausal., presents today for her annual examination.  Her menses are {norm/abn:715}, lasting {number: 22536} days.  Dysmenorrhea {dysmen:716}. She {does:18564} have intermenstrual bleeding. She {does:18564} have vasomotor sx.   Sex activity: {sex active: 315163}. She {does:18564} have vaginal dryness.  Last Pap: 09/01/19 Results were: no abnormalities /neg HPV DNA.  Hx of STDs: {STD hx:14358}  Last mammogram: 10/24/20  Results were: normal--routine follow-up in 12 months There is no FH of breast cancer. There is no FH of ovarian cancer. The patient {does:18564} do self-breast exams.  Colonoscopy: 10 yrs ago;  Repeat due after 10*** years.   Tobacco use: {tob:20664} Alcohol use: {Alcohol:11675} No drug use Exercise: {exercise:31265}  She {does:18564} get adequate calcium and Vitamin D in her diet.  Labs with PCP.   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   Migraine headache 11/06/2016   Rosacea 11/06/2016   Menopausal hot flushes 05/23/2016   GERD (gastroesophageal reflux disease) 01/10/2015   Incomplete emptying of bladder 11/25/2014   Allergic rhinitis, seasonal 11/24/2013   Acoustic neuroma ([place]) 11/24/2013   Asthma without status asthmaticus 11/24/2013   Migraine 11/24/2013   Dyspnea on exertion 11/13/2013   Pedal edema 11/13/2013   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   Urgency of micturation 11/19/2011   FOM (frequency of micturition) 11/19/2011    Past Surgical History:  Procedure Laterality Date   CHOLECYSTECTOMY     CRYOTHERAPY     EYE SURGERY     FOOT SURGERY     SHOULDER SURGERY      Family  History  Problem Relation Age of Onset   Thyroid disease Mother    Heart failure Father    Diabetes Father    Diabetes Paternal Aunt    Breast cancer Neg Hx     Social History   Socioeconomic History   Marital status: Married    Spouse name: Not on file   Number of children: Not on file   Years of education: Not on file   Highest education level: Not on file  Occupational History   Not on file  Tobacco Use   Smoking status: Never   Smokeless tobacco: Never  Vaping Use   Vaping Use: Never used  Substance and Sexual Activity   Alcohol use: Yes    Alcohol/week: 0.0 standard drinks of alcohol   Drug use: No   Sexual activity: Yes    Birth control/protection: None  Other Topics Concern   Not on file  Social History Narrative   Not on file   Social Determinants of Health   Financial Resource Strain: Not on file  Food Insecurity: Not on file  Transportation Needs: Not on file  Physical Activity: Not on file  Stress: Not on file  Social Connections: Not on file  Intimate Partner Violence: Not on file     Current Outpatient Medications:    cholestyramine (QUESTRAN) 4 g packet, Take one packet by mouth twice daily, Disp: 60 each, Rfl: 2   etodolac (LODINE) 400 MG tablet, TAKE ONE (1) TABLET BY MOUTH TWO (2) TIMES DAILY, Disp: 60 tablet, Rfl: 6  famotidine (PEPCID) 20 MG tablet, Take 20 mg by mouth 2 (two) times daily., Disp: , Rfl:    finasteride (PROSCAR) 5 MG tablet, SMARTSIG:.5 Tablet(s) By Mouth Daily, Disp: , Rfl:    oxybutynin (DITROPAN-XL) 10 MG 24 hr tablet, Take 1 tablet (10 mg total) by mouth at bedtime., Disp: 90 tablet, Rfl: 3   vitamin C (ASCORBIC ACID) 500 MG tablet, Take 500 mg by mouth daily., Disp: , Rfl:      ROS:  Review of Systems BREAST: No symptoms    Objective: There were no vitals taken for this visit.   OBGyn Exam  Results: No results found for this or any previous visit (from the past 24 hour(s)).  Assessment/Plan:  No  diagnosis found.   No orders of the defined types were placed in this encounter.           GYN counsel {counseling: 16159}    F/U  No follow-ups on file.  Kadin Canipe B. Cathyann Kilfoyle, PA-C 10/18/2021 3:51 PM

## 2021-10-19 ENCOUNTER — Encounter: Payer: Self-pay | Admitting: Obstetrics and Gynecology

## 2021-10-19 ENCOUNTER — Ambulatory Visit (INDEPENDENT_AMBULATORY_CARE_PROVIDER_SITE_OTHER): Payer: Managed Care, Other (non HMO) | Admitting: Obstetrics and Gynecology

## 2021-10-19 VITALS — BP 124/76 | Ht 63.0 in | Wt 212.6 lb

## 2021-10-19 DIAGNOSIS — Z79890 Hormone replacement therapy (postmenopausal): Secondary | ICD-10-CM | POA: Diagnosis not present

## 2021-10-19 DIAGNOSIS — Z8262 Family history of osteoporosis: Secondary | ICD-10-CM

## 2021-10-19 DIAGNOSIS — Z13820 Encounter for screening for osteoporosis: Secondary | ICD-10-CM

## 2021-10-19 DIAGNOSIS — N3281 Overactive bladder: Secondary | ICD-10-CM

## 2021-10-19 DIAGNOSIS — Z1231 Encounter for screening mammogram for malignant neoplasm of breast: Secondary | ICD-10-CM

## 2021-10-19 DIAGNOSIS — Z01419 Encounter for gynecological examination (general) (routine) without abnormal findings: Secondary | ICD-10-CM

## 2021-10-19 DIAGNOSIS — Z1211 Encounter for screening for malignant neoplasm of colon: Secondary | ICD-10-CM

## 2021-10-19 MED ORDER — ESTRADIOL 0.1 MG/GM VA CREA: 1.0000 g | TOPICAL_CREAM | VAGINAL | 1 refills | Status: DC

## 2021-10-19 MED ORDER — OXYBUTYNIN CHLORIDE ER 10 MG PO TB24: 10.0000 mg | ORAL_TABLET | Freq: Every day | ORAL | 1 refills | Status: DC

## 2021-10-19 NOTE — Patient Instructions (Signed)
I value your feedback and you entrusting us with your care. If you get a [facility] patient survey, I would appreciate you taking the time to let us know about your experience today. Thank you!  [facility]: [phone]      

## 2021-10-27 ENCOUNTER — Other Ambulatory Visit: Payer: Self-pay | Admitting: Obstetrics and Gynecology

## 2021-10-27 ENCOUNTER — Encounter: Payer: Self-pay | Admitting: Obstetrics and Gynecology

## 2021-10-27 DIAGNOSIS — Z1211 Encounter for screening for malignant neoplasm of colon: Secondary | ICD-10-CM

## 2021-12-05 ENCOUNTER — Ambulatory Visit
Admission: RE | Admit: 2021-12-05 | Discharge: 2021-12-05 | Disposition: A | Discharge status: Home / Self Care | Payer: Managed Care, Other (non HMO) | Source: Ambulatory Visit | Attending: Obstetrics and Gynecology | Admitting: Obstetrics and Gynecology

## 2021-12-05 DIAGNOSIS — Z1231 Encounter for screening mammogram for malignant neoplasm of breast: Secondary | ICD-10-CM | POA: Diagnosis present

## 2021-12-05 DIAGNOSIS — Z13820 Encounter for screening for osteoporosis: Secondary | ICD-10-CM | POA: Insufficient documentation

## 2021-12-05 DIAGNOSIS — Z8262 Family history of osteoporosis: Secondary | ICD-10-CM | POA: Diagnosis present

## 2021-12-06 ENCOUNTER — Other Ambulatory Visit: Payer: Self-pay | Admitting: Obstetrics and Gynecology

## 2021-12-06 DIAGNOSIS — R928 Other abnormal and inconclusive findings on diagnostic imaging of breast: Secondary | ICD-10-CM

## 2021-12-06 DIAGNOSIS — N6489 Other specified disorders of breast: Secondary | ICD-10-CM

## 2021-12-11 ENCOUNTER — Encounter: Payer: Self-pay | Admitting: Obstetrics and Gynecology

## 2021-12-26 ENCOUNTER — Ambulatory Visit
Admission: RE | Admit: 2021-12-26 | Discharge: 2021-12-26 | Disposition: A | Discharge status: Home / Self Care | Payer: Managed Care, Other (non HMO) | Source: Ambulatory Visit | Attending: Obstetrics and Gynecology | Admitting: Obstetrics and Gynecology

## 2021-12-26 DIAGNOSIS — N6489 Other specified disorders of breast: Secondary | ICD-10-CM

## 2021-12-26 DIAGNOSIS — R928 Other abnormal and inconclusive findings on diagnostic imaging of breast: Secondary | ICD-10-CM | POA: Insufficient documentation

## 2022-11-22 NOTE — Progress Notes (Signed)
PCP: System, Provider Not In   Chief Complaint  Patient presents with   Gynecologic Exam    Vaginal itching/irritation    HPI:      [name] is a 62 y.o. G0P0000 whose LMP was No LMP recorded. Patient is postmenopausal., presents today for her annual examination.  Her menses are absent due to menopause. She does not have vasomotor sx. No PMB.  Sex activity: single partner, contraception - post menopausal status. She does not have vaginal dryness/pain/bleeding. Is on estrace crm for urinary sx but rarely uses it. Doesn't need Rx RF now.  Has had area of extreme itching vaginally. Treats with mycolog II cream for 1 dose with sx relief. Sx worse when uses toilet paper in public. Carries it with her all the time. Sx for 3-4 yrs but getting worse.   Last Pap: 09/01/19 Results were: no abnormalities /neg HPV DNA.   Last mammogram: 12/26/21  Results were: normal after addl views--routine follow-up in 12 months There is no FH of breast cancer. There is no FH of ovarian cancer. The patient does do self-breast exams.  Colonoscopy: 2023 in [place] was normal, repeat in 10 yrs per pt.   DEXA: 12/05/21 at [facility] was normal;  FH osteoporosis in her mom.   Tobacco use: The patient denies current or previous tobacco use. Alcohol use: social drinker No drug use Exercise: moderately active  She does get adequate calcium and Vitamin D in her diet.  Labs with PCP. Having increased joint pain, also in low back. Is active with yard work, not much stretching. Doing PT.   Pt with dx of OAB by urology ~10 yrs ago. Pt with incontinence but didn't have sensation to go. Pt on oxybutynin 5 mg now for side effects of dry mouth/eyes. Doing better with side effects, OAB fairly controlled.   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   Urgency of micturation 11/19/2011   FOM (frequency of micturition) 11/19/2011    Past Surgical History:  Procedure Laterality Date   CHOLECYSTECTOMY     CRYOTHERAPY     EYE SURGERY     FOOT SURGERY     SHOULDER SURGERY      Family History  Problem Relation Age of Onset   Thyroid disease Mother    Heart failure Father    Diabetes Father    Diabetes Paternal Aunt    Breast cancer Neg Hx     Social History   Socioeconomic History   Marital status: Married    Spouse name: Not on file   Number of children: Not on file   Years of education: Not on file   Highest education level: Not on file  Occupational History   Not on file  Tobacco Use   Smoking status: Never   Smokeless tobacco: Never  Vaping Use   Vaping status: Never Used  Substance and  Sexual Activity   Alcohol use: Yes    Comment: occ   Drug use: No   Sexual activity: Yes    Birth control/protection: None  Other Topics Concern   Not on file  Social History Narrative   Not on file   Social Determinants of Health   Financial Resource Strain: Not on file  Food Insecurity: Not on file  Transportation Needs: Not on file  Physical Activity: Not on file  Stress: Not on file  Social Connections: Not on file  Intimate Partner Violence: Not on file     Current Outpatient Medications:    cholestyramine (QUESTRAN) 4 GM/DOSE powder, Take by mouth 2 (two) times daily., Disp: , Rfl:    estradiol (ESTRACE) 0.1 MG/GM vaginal cream, Place 1 g vaginally  3 (three) times a week. Insert 1 g vaginally 1-2 times weekly, Disp: 42.5 g, Rfl: 1   etodolac (LODINE) 400 MG tablet, TAKE ONE (1) TABLET BY MOUTH TWO (2) TIMES DAILY, Disp: 60 tablet, Rfl: 6   famotidine (PEPCID) 20 MG tablet, Take 20 mg by mouth 2 (two) times daily., Disp: , Rfl:    LUTEIN PO, Take 2 capsules by mouth daily., Disp: , Rfl:    nystatin-triamcinolone (MYCOLOG II) cream, Apply topically., Disp: , Rfl:    Turmeric (QC TUMERIC COMPLEX) 500 MG CAPS, Take by mouth., Disp: , Rfl:    vitamin C (ASCORBIC ACID) 500 MG tablet, Take 500 mg by mouth daily., Disp: , Rfl:    diclofenac (VOLTAREN) 75 MG EC tablet, Take 1 tablet twice a day by oral route with meal(s). (Patient not taking: Reported on 11/26/2022), Disp: , Rfl:    oxybutynin (DITROPAN) 5 MG tablet, Take 1 tablet (5 mg total) by mouth daily., Disp: 90 tablet, Rfl: 3     ROS:  Review of Systems  Constitutional:  Negative for fatigue, fever and unexpected weight change.  Respiratory:  Negative for cough, shortness of breath and wheezing.   Cardiovascular:  Negative for chest pain, palpitations and leg swelling.  Gastrointestinal:  Negative for blood in stool, constipation, diarrhea, nausea and vomiting.  Endocrine: Negative for cold intolerance, heat intolerance and polyuria.  Genitourinary:  Positive for frequency. Negative for dyspareunia, dysuria, flank pain, genital sores, hematuria, menstrual problem, pelvic pain, urgency, vaginal bleeding, vaginal discharge and vaginal pain.  Musculoskeletal:  Positive for arthralgias. Negative for back pain, joint swelling and myalgias.  Skin:  Negative for rash.  Neurological:  Negative for dizziness, syncope, light-headedness, numbness and headaches.  Hematological:  Negative for adenopathy.  Psychiatric/Behavioral:  Negative for agitation, confusion, sleep disturbance and suicidal ideas. The patient is not nervous/anxious.    BREAST: No symptoms    Objective: BP 128/86   Ht 5'  3" (1.6 m)   Wt 211 lb (95.7 kg)   BMI 37.38 kg/m    Physical Exam Constitutional:      Appearance: She is well-developed.  Genitourinary:     Vulva normal.     Right Labia: rash.     Right Labia: No tenderness or lesions.    Left Labia: rash.     Left Labia: No tenderness or lesions.       No vaginal discharge, erythema or tenderness.      Right Adnexa: not tender and no mass present.    Left Adnexa: not tender and no mass present.    No cervical friability or polyp.     Uterus is not enlarged or tender.  Breasts:    Right:  No mass, nipple discharge, skin change or tenderness.     Left: No mass, nipple discharge, skin change or tenderness.  Neck:     Thyroid: No thyromegaly.  Cardiovascular:     Rate and Rhythm: Normal rate and regular rhythm.     Heart sounds: Normal heart sounds. No murmur heard. Pulmonary:     Effort: Pulmonary effort is normal.     Breath sounds: Normal breath sounds.  Abdominal:     Palpations: Abdomen is soft.     Tenderness: There is no abdominal tenderness. There is no guarding or rebound.  Musculoskeletal:        General: Normal range of motion.     Cervical back: Normal range of motion.  Lymphadenopathy:     Cervical: No cervical adenopathy.  Neurological:     General: No focal deficit present.     Mental Status: She is alert and oriented to person, place, and time.     Cranial Nerves: No cranial nerve deficit.  Skin:    General: Skin is warm and dry.  Psychiatric:        Mood and Affect: Mood normal.        Behavior: Behavior normal.        Thought Content: Thought content normal.        Judgment: Judgment normal.  Vitals reviewed.   VULVAR BIOPSY NOTE The indications for vulvar biopsy (rule out neoplasia, establish lichen sclerosus diagnosis) were reviewed.   Risks of the biopsy including pain, bleeding, infection, inadequate specimen, scarring and need for additional procedures  were discussed. The patient stated understanding  and agreed to undergo procedure today. Consent was signed,  time out performed.   The patient's vulva was prepped with Betadine. 1% lidocaine was injected into area of concern. A 3.0-mm punch biopsy was done, biopsy tissue was picked up with sterile forceps and sterile scissors were used to excise the lesion.  Small bleeding was noted and hemostasis was achieved using silver nitrate sticks.  The patient tolerated the procedure well. Post-procedure instructions  (pelvic rest for one week) were given to the patient. The patient is to call with heavy bleeding, fever greater than 100.4, foul smelling vaginal discharge or other concerns.    Assessment/Plan: Encounter for annual routine gynecological examination  Cervical cancer screening - Plan: Cytology - PAP  Screening for HPV (human papillomavirus) - Plan: Cytology - PAP  Encounter for screening mammogram for malignant neoplasm of breast - Plan: MM 3D SCREENING MAMMOGRAM BILATERAL BREAST; pt to schedule mammo  OAB (overactive bladder) - Plan: oxybutynin (DITROPAN) 5 MG tablet; Rx RF  Vaginal dryness, menopausal--will call for vag ERT RF prn.   Vaginal itching - Plan: Surgical pathology; exam c/w with LS. Vulvar bx today. Will f/u with results. Discussed clobetasol tx.   Meds ordered this encounter  Medications   oxybutynin (DITROPAN) 5 MG tablet    Sig: Take 1 tablet (5 mg total) by mouth daily.    Dispense:  90 tablet    Refill:  3    Order Specific Question:   Supervising Provider    Answer:   Waymon Budge            GYN counsel breast self exam, mammography screening, menopause, adequate intake of calcium and vitamin D, diet and exercise    F/U  Return in about 1 year (around 11/26/2023).  Jamil Armwood B. Antwan Pandya, PA-C 11/26/2022 5:17 PM

## 2022-11-26 ENCOUNTER — Other Ambulatory Visit (HOSPITAL_COMMUNITY)
Admission: RE | Admit: 2022-11-26 | Discharge: 2022-11-26 | Disposition: A | Discharge status: Home / Self Care | Payer: Managed Care, Other (non HMO) | Source: Ambulatory Visit | Attending: Obstetrics and Gynecology | Admitting: Obstetrics and Gynecology

## 2022-11-26 ENCOUNTER — Encounter: Payer: Self-pay | Admitting: Obstetrics and Gynecology

## 2022-11-26 ENCOUNTER — Ambulatory Visit: Payer: Managed Care, Other (non HMO) | Admitting: Obstetrics and Gynecology

## 2022-11-26 VITALS — BP 128/86 | Ht 63.0 in | Wt 211.0 lb

## 2022-11-26 DIAGNOSIS — Z1231 Encounter for screening mammogram for malignant neoplasm of breast: Secondary | ICD-10-CM

## 2022-11-26 DIAGNOSIS — N898 Other specified noninflammatory disorders of vagina: Secondary | ICD-10-CM

## 2022-11-26 DIAGNOSIS — Z1151 Encounter for screening for human papillomavirus (HPV): Secondary | ICD-10-CM

## 2022-11-26 DIAGNOSIS — L900 Lichen sclerosus et atrophicus: Secondary | ICD-10-CM

## 2022-11-26 DIAGNOSIS — N904 Leukoplakia of vulva: Secondary | ICD-10-CM

## 2022-11-26 DIAGNOSIS — Z01411 Encounter for gynecological examination (general) (routine) with abnormal findings: Secondary | ICD-10-CM

## 2022-11-26 DIAGNOSIS — L292 Pruritus vulvae: Secondary | ICD-10-CM | POA: Diagnosis present

## 2022-11-26 DIAGNOSIS — Z79890 Hormone replacement therapy (postmenopausal): Secondary | ICD-10-CM

## 2022-11-26 DIAGNOSIS — N951 Menopausal and female climacteric states: Secondary | ICD-10-CM

## 2022-11-26 DIAGNOSIS — Z01419 Encounter for gynecological examination (general) (routine) without abnormal findings: Secondary | ICD-10-CM

## 2022-11-26 DIAGNOSIS — Z1211 Encounter for screening for malignant neoplasm of colon: Secondary | ICD-10-CM

## 2022-11-26 DIAGNOSIS — Z124 Encounter for screening for malignant neoplasm of cervix: Secondary | ICD-10-CM

## 2022-11-26 DIAGNOSIS — N3281 Overactive bladder: Secondary | ICD-10-CM

## 2022-11-26 MED ORDER — OXYBUTYNIN CHLORIDE 5 MG PO TABS: 5.0000 mg | ORAL_TABLET | Freq: Every day | ORAL | 3 refills | Status: DC

## 2022-11-26 NOTE — Patient Instructions (Signed)
I value your feedback and you entrusting us with your care. If you get a [facility] patient survey, I would appreciate you taking the time to let us know about your experience today. Thank you!  [facility] ([place])--[phone]  

## 2022-11-28 LAB — SURGICAL PATHOLOGY

## 2022-11-28 MED ORDER — CLOBETASOL PROPIONATE 0.05 % EX OINT: TOPICAL_OINTMENT | CUTANEOUS | 1 refills | Status: DC

## 2022-11-28 NOTE — Addendum Note (Signed)
Addended by: Althea Grimmer B on: 11/28/2022 03:35 PM   Modules accepted: Orders

## 2022-11-29 LAB — CYTOLOGY - PAP
Adequacy: ABSENT
Comment: NEGATIVE
Diagnosis: NEGATIVE
High risk HPV: NEGATIVE

## 2022-11-30 ENCOUNTER — Telehealth: Payer: Self-pay

## 2022-11-30 NOTE — Telephone Encounter (Signed)
Pharmacy needs pea-sized converted in to gram amount. She said insurance is so picky that do not allow pea-sized amounts any more. Please advise patient is waiting on medication.

## 2022-12-03 ENCOUNTER — Other Ambulatory Visit: Payer: Self-pay | Admitting: Obstetrics and Gynecology

## 2022-12-03 DIAGNOSIS — L900 Lichen sclerosus et atrophicus: Secondary | ICD-10-CM

## 2022-12-03 MED ORDER — CLOBETASOL PROPIONATE 0.05 % EX OINT: TOPICAL_OINTMENT | CUTANEOUS | 1 refills | Status: DC

## 2022-12-03 NOTE — Progress Notes (Signed)
Rx clarification for insurance coverage

## 2022-12-03 NOTE — Telephone Encounter (Signed)
Rx modified and resent.

## 2022-12-04 ENCOUNTER — Encounter: Payer: Self-pay | Admitting: Obstetrics and Gynecology

## 2022-12-04 NOTE — Telephone Encounter (Signed)
Robin from [facility] in [place] calling; the clobetazole order reads pea size 0.25mg ; needs to be in grams since that is the unit of measure of the tube; please call and if okay she will fix it.  It will also help them calculate better.  [phone]

## 2023-01-01 ENCOUNTER — Ambulatory Visit
Admission: RE | Admit: 2023-01-01 | Discharge: 2023-01-01 | Disposition: A | Discharge status: Home / Self Care | Payer: Managed Care, Other (non HMO) | Source: Ambulatory Visit | Attending: Obstetrics and Gynecology | Admitting: Obstetrics and Gynecology

## 2023-01-01 DIAGNOSIS — Z1231 Encounter for screening mammogram for malignant neoplasm of breast: Secondary | ICD-10-CM | POA: Insufficient documentation

## 2023-12-02 NOTE — Progress Notes (Unsigned)
 PCP: System, Provider Not In   No chief complaint on file.   HPI:      [name] is a 63 y.o. G0P0000 whose LMP was No LMP recorded. Patient is postmenopausal., presents today for her annual examination.  Her menses are absent due to menopause. She does not have vasomotor sx. No PMB.  Sex activity: single partner, contraception - post menopausal status. She does not have vaginal dryness/pain/bleeding. Is on estrace  crm for urinary sx but rarely uses it. Doesn't need Rx RF now.  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 Has had area of extreme itching vaginally. Treats with mycolog II cream for 1 dose with sx relief. Sx worse when uses toilet paper in public. Carries it with her all the time. Sx for 3-4 yrs but getting worse.   Last Pap: 11/26/22 Results were: no abnormalities /neg HPV DNA.   Last mammogram: 01/01/23  Results were: normal after addl views--routine follow-up in 12 months There is no FH of breast cancer. There is no FH of ovarian cancer. The patient does do self-breast exams.  Colonoscopy: 2023 in [place] was normal, repeat in 10 yrs per pt.   DEXA: 12/05/21 at [facility] was normal;  FH osteoporosis in her mom.   Tobacco use: The patient denies current or previous tobacco use. Alcohol use: social drinker No drug use Exercise: moderately active  She does get adequate calcium and Vitamin D  in her diet.  Labs with PCP. Having increased joint pain, also in low back. Is active with yard work, not much stretching. Doing PT.   Pt with dx of OAB by urology ~10 yrs ago. Pt with incontinence but didn't have sensation to go. Pt on oxybutynin  5 mg now for side effects of dry mouth/eyes. Doing better with side effects, OAB fairly controlled.   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   Migraine  headache 11/06/2016   Rosacea 11/06/2016   Menopausal hot flushes 05/23/2016   GERD (gastroesophageal reflux disease) 01/10/2015   Incomplete emptying of bladder 11/25/2014   Allergic rhinitis, seasonal 11/24/2013   Acoustic neuroma (HCC) 11/24/2013   Asthma without status asthmaticus 11/24/2013   Migraine 11/24/2013   Dyspnea on exertion 11/13/2013   Pedal edema 11/13/2013   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   Urinary urgency 11/19/2011   FOM (frequency of micturition) 11/19/2011    Past Surgical History:  Procedure Laterality Date   CHOLECYSTECTOMY     CRYOTHERAPY     EYE SURGERY     FOOT SURGERY     SHOULDER SURGERY      Family History  Problem Relation Age of Onset   Thyroid disease Mother    Heart failure Father    Diabetes Father    Diabetes Paternal Aunt    Breast cancer Neg Hx     Social History   Socioeconomic History   Marital status: Married    Spouse name: Not on file   Number of children: Not on file   Years of education: Not on file   Highest education level: Not on file  Occupational History   Not on file  Tobacco Use   Smoking status: Never   Smokeless tobacco: Never  Vaping Use   Vaping status: Never Used  Substance and Sexual Activity  Alcohol use: Yes    Comment: occ   Drug use: No   Sexual activity: Yes    Birth control/protection: None  Other Topics Concern   Not on file  Social History Narrative   Not on file   Social Drivers of Health   Financial Resource Strain: Not on file  Food Insecurity: Not on file  Transportation Needs: Not on file  Physical Activity: Not on file  Stress: Not on file  Social Connections: Not on file  Intimate Partner Violence: Not on file     Current Outpatient Medications:    cholestyramine  (QUESTRAN ) 4 GM/DOSE powder, Take by mouth 2 (two) times daily., Disp: , Rfl:    clobetasol  ointment (TEMOVATE ) 0.05 %, Apply pea-sized amount (0.25 mg) to affected  vulvar/labial area every night for 4 weeks, then every other day for 4 weeks and then twice a week for 4 weeks or until resolution., Disp: 45 g, Rfl: 1   diclofenac (VOLTAREN) 75 MG EC tablet, Take 1 tablet twice a day by oral route with meal(s). (Patient not taking: Reported on 11/26/2022), Disp: , Rfl:    estradiol  (ESTRACE ) 0.1 MG/GM vaginal cream, Place 1 g vaginally 3 (three) times a week. Insert 1 g vaginally 1-2 times weekly, Disp: 42.5 g, Rfl: 1   etodolac  (LODINE ) 400 MG tablet, TAKE ONE (1) TABLET BY MOUTH TWO (2) TIMES DAILY, Disp: 60 tablet, Rfl: 6   famotidine (PEPCID) 20 MG tablet, Take 20 mg by mouth 2 (two) times daily., Disp: , Rfl:    LUTEIN PO, Take 2 capsules by mouth daily., Disp: , Rfl:    nystatin-triamcinolone  (MYCOLOG II) cream, Apply topically., Disp: , Rfl:    oxybutynin  (DITROPAN ) 5 MG tablet, Take 1 tablet (5 mg total) by mouth daily., Disp: 90 tablet, Rfl: 3   Turmeric (QC TUMERIC COMPLEX) 500 MG CAPS, Take by mouth., Disp: , Rfl:    vitamin C (ASCORBIC ACID) 500 MG tablet, Take 500 mg by mouth daily., Disp: , Rfl:      ROS:  Review of Systems  Constitutional:  Negative for fatigue, fever and unexpected weight change.  Respiratory:  Negative for cough, shortness of breath and wheezing.   Cardiovascular:  Negative for chest pain, palpitations and leg swelling.  Gastrointestinal:  Negative for blood in stool, constipation, diarrhea, nausea and vomiting.  Endocrine: Negative for cold intolerance, heat intolerance and polyuria.  Genitourinary:  Positive for frequency. Negative for dyspareunia, dysuria, flank pain, genital sores, hematuria, menstrual problem, pelvic pain, urgency, vaginal bleeding, vaginal discharge and vaginal pain.  Musculoskeletal:  Positive for arthralgias. Negative for back pain, joint swelling and myalgias.  Skin:  Negative for rash.  Neurological:  Negative for dizziness, syncope, light-headedness, numbness and headaches.  Hematological:   Negative for adenopathy.  Psychiatric/Behavioral:  Negative for agitation, confusion, sleep disturbance and suicidal ideas. The patient is not nervous/anxious.    BREAST: No symptoms    Objective: There were no vitals taken for this visit.   Physical Exam Constitutional:      Appearance: She is well-developed.  Genitourinary:     Vulva normal.     Right Labia: rash.     Right Labia: No tenderness or lesions.    Left Labia: rash.     Left Labia: No tenderness or lesions.    No vaginal discharge, erythema or tenderness.      Right Adnexa: not tender and no mass present.    Left Adnexa: not tender and no mass present.  No cervical friability or polyp.     Uterus is not enlarged or tender.  Breasts:    Right: No mass, nipple discharge, skin change or tenderness.     Left: No mass, nipple discharge, skin change or tenderness.  Neck:     Thyroid: No thyromegaly.  Cardiovascular:     Rate and Rhythm: Normal rate and regular rhythm.     Heart sounds: Normal heart sounds. No murmur heard. Pulmonary:     Effort: Pulmonary effort is normal.     Breath sounds: Normal breath sounds.  Abdominal:     Palpations: Abdomen is soft.     Tenderness: There is no abdominal tenderness. There is no guarding or rebound.  Musculoskeletal:        General: Normal range of motion.     Cervical back: Normal range of motion.  Lymphadenopathy:     Cervical: No cervical adenopathy.  Neurological:     General: No focal deficit present.     Mental Status: She is alert and oriented to person, place, and time.     Cranial Nerves: No cranial nerve deficit.  Skin:    General: Skin is warm and dry.  Psychiatric:        Mood and Affect: Mood normal.        Behavior: Behavior normal.        Thought Content: Thought content normal.        Judgment: Judgment normal.  Vitals reviewed.   VULVAR BIOPSY NOTE The indications for vulvar biopsy (rule out neoplasia, establish lichen sclerosus diagnosis)  were reviewed.   Risks of the biopsy including pain, bleeding, infection, inadequate specimen, scarring and need for additional procedures  were discussed. The patient stated understanding and agreed to undergo procedure today. Consent was signed,  time out performed.   The patient's vulva was prepped with Betadine. 1% lidocaine was injected into area of concern. A 3.0-mm punch biopsy was done, biopsy tissue was picked up with sterile forceps and sterile scissors were used to excise the lesion.  Small bleeding was noted and hemostasis was achieved using silver nitrate sticks.  The patient tolerated the procedure well. Post-procedure instructions  (pelvic rest for one week) were given to the patient. The patient is to call with heavy bleeding, fever greater than 100.4, foul smelling vaginal discharge or other concerns.    Assessment/Plan: Encounter for annual routine gynecological examination  Cervical cancer screening - Plan: Cytology - PAP  Screening for HPV (human papillomavirus) - Plan: Cytology - PAP  Encounter for screening mammogram for malignant neoplasm of breast - Plan: MM 3D SCREENING MAMMOGRAM BILATERAL BREAST; pt to schedule mammo  OAB (overactive bladder) - Plan: oxybutynin  (DITROPAN ) 5 MG tablet; Rx RF  Vaginal dryness, menopausal--will call for vag ERT RF prn.   Vaginal itching - Plan: Surgical pathology; exam c/w with LS. Vulvar bx today. Will f/u with results. Discussed clobetasol  tx.   No orders of the defined types were placed in this encounter.           GYN counsel breast self exam, mammography screening, menopause, adequate intake of calcium and vitamin D , diet and exercise    F/U  No follow-ups on file.  Quashaun Lazalde B. Will Schier, PA-C 12/02/2023 12:49 PM

## 2023-12-03 ENCOUNTER — Encounter: Payer: Self-pay | Admitting: Obstetrics and Gynecology

## 2023-12-03 ENCOUNTER — Ambulatory Visit (INDEPENDENT_AMBULATORY_CARE_PROVIDER_SITE_OTHER): Admitting: Obstetrics and Gynecology

## 2023-12-03 VITALS — BP 109/69 | HR 65 | Ht 63.0 in | Wt 192.0 lb

## 2023-12-03 DIAGNOSIS — Z1231 Encounter for screening mammogram for malignant neoplasm of breast: Secondary | ICD-10-CM

## 2023-12-03 DIAGNOSIS — N3281 Overactive bladder: Secondary | ICD-10-CM

## 2023-12-03 DIAGNOSIS — Z01411 Encounter for gynecological examination (general) (routine) with abnormal findings: Secondary | ICD-10-CM | POA: Diagnosis not present

## 2023-12-03 DIAGNOSIS — N3946 Mixed incontinence: Secondary | ICD-10-CM | POA: Diagnosis not present

## 2023-12-03 DIAGNOSIS — N3942 Incontinence without sensory awareness: Secondary | ICD-10-CM

## 2023-12-03 DIAGNOSIS — Z01419 Encounter for gynecological examination (general) (routine) without abnormal findings: Secondary | ICD-10-CM

## 2023-12-03 DIAGNOSIS — L900 Lichen sclerosus et atrophicus: Secondary | ICD-10-CM | POA: Diagnosis not present

## 2023-12-03 MED ORDER — OXYBUTYNIN CHLORIDE 5 MG PO TABS: 5.0000 mg | ORAL_TABLET | Freq: Every day | ORAL | 3 refills | Status: AC

## 2023-12-03 MED ORDER — CLOBETASOL PROPIONATE 0.05 % EX OINT: TOPICAL_OINTMENT | CUTANEOUS | 0 refills | Status: AC

## 2023-12-03 NOTE — Patient Instructions (Signed)
 I value your feedback and you entrusting [place] with your care. If you get a [facility] patient survey, I would appreciate you taking the time to let us know about your experience today. Thank you!  [facility] ([place])--[phone]

## 2024-01-15 ENCOUNTER — Ambulatory Visit
Admission: RE | Admit: 2024-01-15 | Discharge: 2024-01-15 | Disposition: A | Discharge status: Home / Self Care | Source: Ambulatory Visit | Attending: Obstetrics and Gynecology | Admitting: Obstetrics and Gynecology

## 2024-01-15 DIAGNOSIS — Z1231 Encounter for screening mammogram for malignant neoplasm of breast: Secondary | ICD-10-CM | POA: Insufficient documentation

## 2024-01-26 ENCOUNTER — Ambulatory Visit: Payer: Self-pay | Admitting: Obstetrics and Gynecology

## 2024-04-23 ENCOUNTER — Ambulatory Visit: Admitting: Physical Therapy

## 2024-04-30 ENCOUNTER — Ambulatory Visit: Admitting: Physical Therapy

## 2024-05-07 ENCOUNTER — Ambulatory Visit: Admitting: Physical Therapy

## 2024-05-14 ENCOUNTER — Ambulatory Visit: Admitting: Physical Therapy

## 2024-05-21 ENCOUNTER — Ambulatory Visit: Admitting: Physical Therapy

## 2024-05-28 ENCOUNTER — Ambulatory Visit: Admitting: Physical Therapy

## 2024-06-04 ENCOUNTER — Ambulatory Visit: Admitting: Physical Therapy

## 2024-06-11 ENCOUNTER — Ambulatory Visit: Admitting: Physical Therapy

## 2024-06-18 ENCOUNTER — Ambulatory Visit: Admitting: Physical Therapy

## 2024-07-02 ENCOUNTER — Ambulatory Visit: Admitting: Physical Therapy

## 2024-07-09 ENCOUNTER — Ambulatory Visit: Admitting: Physical Therapy
